# Patient Record
Sex: Male | Born: 1965 | Race: White | Hispanic: No | Marital: Married | Smoking: Former smoker
Health system: Southern US, Community
[De-identification: ages and names within clinical notes are randomized; demographics above are authoritative.]

## PROBLEM LIST (undated history)

## (undated) DIAGNOSIS — G4730 Sleep apnea, unspecified: Secondary | ICD-10-CM

## (undated) DIAGNOSIS — N400 Enlarged prostate without lower urinary tract symptoms: Secondary | ICD-10-CM

## (undated) DIAGNOSIS — I2510 Atherosclerotic heart disease of native coronary artery without angina pectoris: Secondary | ICD-10-CM

## (undated) DIAGNOSIS — C801 Malignant (primary) neoplasm, unspecified: Secondary | ICD-10-CM

## (undated) DIAGNOSIS — E785 Hyperlipidemia, unspecified: Secondary | ICD-10-CM

## (undated) DIAGNOSIS — R112 Nausea with vomiting, unspecified: Secondary | ICD-10-CM

## (undated) DIAGNOSIS — K649 Unspecified hemorrhoids: Secondary | ICD-10-CM

## (undated) DIAGNOSIS — M51369 Other intervertebral disc degeneration, lumbar region without mention of lumbar back pain or lower extremity pain: Secondary | ICD-10-CM

## (undated) DIAGNOSIS — C439 Malignant melanoma of skin, unspecified: Secondary | ICD-10-CM

## (undated) DIAGNOSIS — M5136 Other intervertebral disc degeneration, lumbar region: Secondary | ICD-10-CM

## (undated) DIAGNOSIS — R7303 Prediabetes: Secondary | ICD-10-CM

## (undated) DIAGNOSIS — E039 Hypothyroidism, unspecified: Secondary | ICD-10-CM

## (undated) DIAGNOSIS — K219 Gastro-esophageal reflux disease without esophagitis: Secondary | ICD-10-CM

## (undated) HISTORY — DX: Gastro-esophageal reflux disease without esophagitis: K21.9

## (undated) HISTORY — DX: Malignant (primary) neoplasm, unspecified: C80.1

## (undated) HISTORY — DX: Unspecified hemorrhoids: K64.9

## (undated) HISTORY — DX: Benign prostatic hyperplasia without lower urinary tract symptoms: N40.0

## (undated) HISTORY — DX: Other intervertebral disc degeneration, lumbar region: M51.36

## (undated) HISTORY — DX: Hyperlipidemia, unspecified: E78.5

## (undated) HISTORY — DX: Other intervertebral disc degeneration, lumbar region without mention of lumbar back pain or lower extremity pain: M51.369

## (undated) HISTORY — PX: VASECTOMY: SHX75

## (undated) HISTORY — DX: Malignant melanoma of skin, unspecified: C43.9

## (undated) HISTORY — DX: Atherosclerotic heart disease of native coronary artery without angina pectoris: I25.10

---

## 2003-03-31 ENCOUNTER — Inpatient Hospital Stay (HOSPITAL_COMMUNITY): Admission: EM | Admit: 2003-03-31 | Discharge: 2003-04-02 | Payer: Self-pay | Admitting: Emergency Medicine

## 2014-03-19 ENCOUNTER — Other Ambulatory Visit: Payer: Self-pay | Admitting: Neurosurgery

## 2014-03-19 DIAGNOSIS — M5126 Other intervertebral disc displacement, lumbar region: Secondary | ICD-10-CM

## 2014-03-20 ENCOUNTER — Other Ambulatory Visit: Payer: Self-pay | Admitting: Neurosurgery

## 2014-03-20 ENCOUNTER — Ambulatory Visit
Admission: RE | Admit: 2014-03-20 | Discharge: 2014-03-20 | Disposition: A | Discharge status: Home / Self Care | Payer: BC Managed Care – PPO | Source: Ambulatory Visit | Attending: Neurosurgery | Admitting: Neurosurgery

## 2014-03-20 DIAGNOSIS — M5126 Other intervertebral disc displacement, lumbar region: Secondary | ICD-10-CM

## 2014-03-20 MED ORDER — METHYLPREDNISOLONE ACETATE 40 MG/ML INJ SUSP (RADIOLOG
120.0000 mg | Freq: Once | INTRAMUSCULAR | Status: AC
  Administered 2014-03-20: 120 mg via EPIDURAL

## 2014-03-20 MED ORDER — IOHEXOL 180 MG/ML  SOLN
1.0000 mL | Freq: Once | INTRAMUSCULAR | Status: AC | PRN
  Administered 2014-03-20: 1 mL via EPIDURAL

## 2014-03-20 NOTE — Discharge Instructions (Signed)

## 2014-04-07 ENCOUNTER — Other Ambulatory Visit: Payer: Self-pay | Admitting: Neurosurgery

## 2014-04-07 DIAGNOSIS — M5126 Other intervertebral disc displacement, lumbar region: Secondary | ICD-10-CM

## 2014-04-16 ENCOUNTER — Ambulatory Visit
Admission: RE | Admit: 2014-04-16 | Discharge: 2014-04-16 | Disposition: A | Discharge status: Home / Self Care | Payer: BC Managed Care – PPO | Source: Ambulatory Visit | Attending: Neurosurgery | Admitting: Neurosurgery

## 2014-04-16 DIAGNOSIS — M5126 Other intervertebral disc displacement, lumbar region: Secondary | ICD-10-CM

## 2014-04-16 IMAGING — RF DG FACET JT INJ L OR S SPINE SINGLE LEVEL *L*
1 series · 1 of 1 positions shown · IV contrast (omnipaque)
Comparison: none

CLINICAL DATA: Lumbosacral spondylosis without myelopathy. Facet
mediated lumbago. Excellent relief following the previous trans
foraminal steroid injection.

EXAM:
LEFT L4-5  FACET INJECTION UNDER FLUOROSCOPY
FLUOROSCOPY TIME:  41 seconds
TECHNIQUE: An appropriate skin entry site was determined fluoroscopically and
marked. Operator donned sterile gloves and mask. Site prepped with
betadine, draped in usual sterile fashion, and infiltrated locally
with 1% lidocaine. A 22 gauge spinal needle was advanced to the
posterior margin of the LEFT L4-5 facet under fluoroscopy.
Diagnostic injection of 0.5 ml Omnipaque 180 confirmed
intra-articular spread without any intrathecal or intravascular
component. 120mg Depo-Medrol in 1 ml lidocaine 1% was administered.

[Series 1: dg facet jt inj l /s single level*l*w/fl · 1 of 1 slices shown]
[im 1/1]
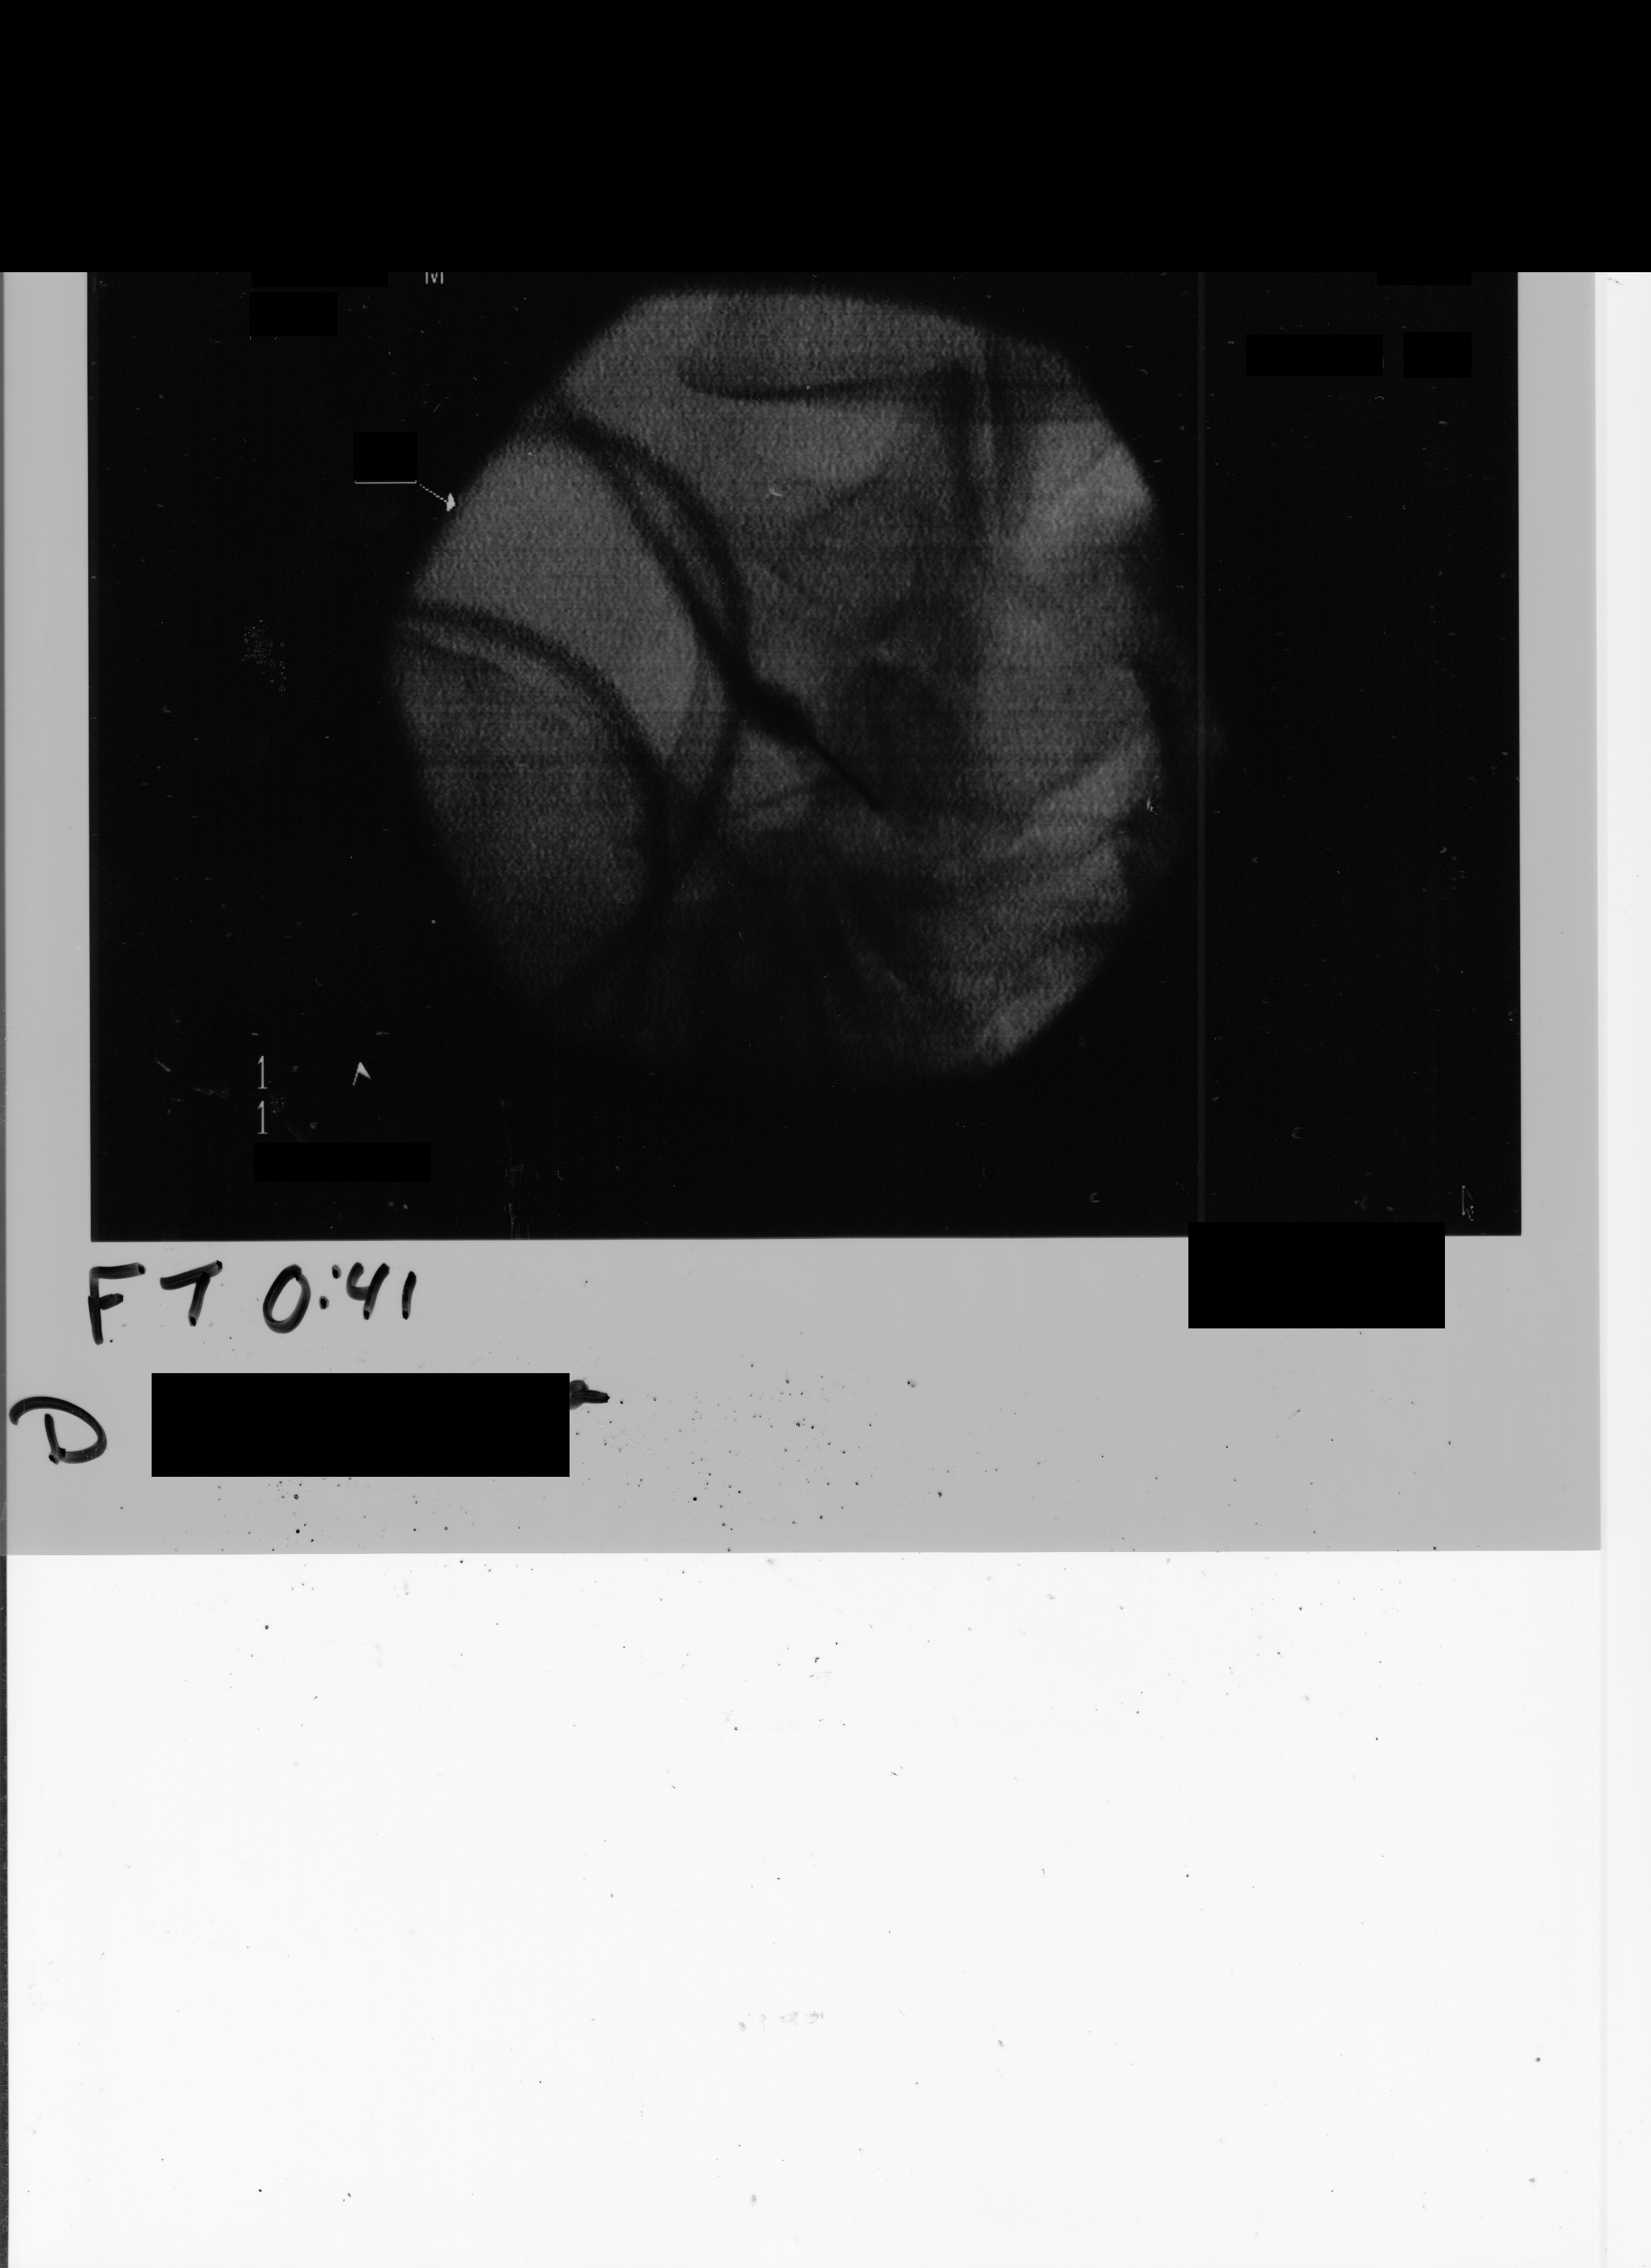

[1 of 1 positions shown; findings below may reference images not displayed]

IMPRESSION: Technically successful LEFT L4-5 facet injection.

## 2014-04-16 MED ORDER — IOHEXOL 180 MG/ML  SOLN
1.0000 mL | Freq: Once | INTRAMUSCULAR | Status: AC | PRN
  Administered 2014-04-16: 1 mL via INTRA_ARTICULAR

## 2014-04-16 MED ORDER — METHYLPREDNISOLONE ACETATE 40 MG/ML INJ SUSP (RADIOLOG
120.0000 mg | Freq: Once | INTRAMUSCULAR | Status: AC
  Administered 2014-04-16: 120 mg via INTRA_ARTICULAR

## 2014-04-16 NOTE — Discharge Instructions (Signed)

## 2015-06-10 ENCOUNTER — Other Ambulatory Visit: Payer: Self-pay | Admitting: Neurosurgery

## 2015-06-10 DIAGNOSIS — M5137 Other intervertebral disc degeneration, lumbosacral region: Secondary | ICD-10-CM

## 2015-06-15 ENCOUNTER — Ambulatory Visit
Admission: RE | Admit: 2015-06-15 | Discharge: 2015-06-15 | Disposition: A | Discharge status: Home / Self Care | Payer: BLUE CROSS/BLUE SHIELD | Source: Ambulatory Visit | Attending: Neurosurgery | Admitting: Neurosurgery

## 2015-06-15 DIAGNOSIS — M5137 Other intervertebral disc degeneration, lumbosacral region: Secondary | ICD-10-CM

## 2015-06-15 MED ORDER — IOHEXOL 180 MG/ML  SOLN
1.0000 mL | Freq: Once | INTRAMUSCULAR | Status: AC | PRN
  Administered 2015-06-15: 1 mL via INTRA_ARTICULAR

## 2015-06-15 MED ORDER — METHYLPREDNISOLONE ACETATE 40 MG/ML INJ SUSP (RADIOLOG
120.0000 mg | Freq: Once | INTRAMUSCULAR | Status: AC
  Administered 2015-06-15: 120 mg via INTRA_ARTICULAR

## 2015-06-15 NOTE — Discharge Instructions (Signed)

## 2015-07-29 ENCOUNTER — Other Ambulatory Visit: Payer: Self-pay | Admitting: Neurosurgery

## 2015-07-29 DIAGNOSIS — M5137 Other intervertebral disc degeneration, lumbosacral region: Secondary | ICD-10-CM

## 2015-08-04 ENCOUNTER — Ambulatory Visit
Admission: RE | Admit: 2015-08-04 | Discharge: 2015-08-04 | Disposition: A | Discharge status: Home / Self Care | Payer: BLUE CROSS/BLUE SHIELD | Source: Ambulatory Visit | Attending: Neurosurgery | Admitting: Neurosurgery

## 2015-08-04 DIAGNOSIS — M5137 Other intervertebral disc degeneration, lumbosacral region: Secondary | ICD-10-CM

## 2015-08-04 MED ORDER — IOHEXOL 180 MG/ML  SOLN
1.0000 mL | Freq: Once | INTRAMUSCULAR | Status: AC | PRN
  Administered 2015-08-04: 1 mL via EPIDURAL

## 2015-08-04 MED ORDER — METHYLPREDNISOLONE ACETATE 40 MG/ML INJ SUSP (RADIOLOG
120.0000 mg | Freq: Once | INTRAMUSCULAR | Status: AC
  Administered 2015-08-04: 120 mg via EPIDURAL

## 2015-08-04 NOTE — Discharge Instructions (Signed)

## 2016-03-03 DIAGNOSIS — R946 Abnormal results of thyroid function studies: Secondary | ICD-10-CM | POA: Diagnosis not present

## 2016-03-03 DIAGNOSIS — Z0000 Encounter for general adult medical examination without abnormal findings: Secondary | ICD-10-CM | POA: Diagnosis not present

## 2016-03-03 DIAGNOSIS — Z125 Encounter for screening for malignant neoplasm of prostate: Secondary | ICD-10-CM | POA: Diagnosis not present

## 2016-03-10 DIAGNOSIS — Z8249 Family history of ischemic heart disease and other diseases of the circulatory system: Secondary | ICD-10-CM | POA: Diagnosis not present

## 2016-03-10 DIAGNOSIS — Z1389 Encounter for screening for other disorder: Secondary | ICD-10-CM | POA: Diagnosis not present

## 2016-03-10 DIAGNOSIS — Z23 Encounter for immunization: Secondary | ICD-10-CM | POA: Diagnosis not present

## 2016-03-10 DIAGNOSIS — M545 Low back pain: Secondary | ICD-10-CM | POA: Diagnosis not present

## 2016-03-10 DIAGNOSIS — Z0000 Encounter for general adult medical examination without abnormal findings: Secondary | ICD-10-CM | POA: Diagnosis not present

## 2016-03-10 DIAGNOSIS — M5136 Other intervertebral disc degeneration, lumbar region: Secondary | ICD-10-CM | POA: Diagnosis not present

## 2016-03-10 DIAGNOSIS — Z8042 Family history of malignant neoplasm of prostate: Secondary | ICD-10-CM | POA: Diagnosis not present

## 2016-03-14 DIAGNOSIS — Z23 Encounter for immunization: Secondary | ICD-10-CM | POA: Diagnosis not present

## 2016-03-17 ENCOUNTER — Other Ambulatory Visit: Payer: Self-pay | Admitting: Neurosurgery

## 2016-03-17 DIAGNOSIS — D2262 Melanocytic nevi of left upper limb, including shoulder: Secondary | ICD-10-CM | POA: Diagnosis not present

## 2016-03-17 DIAGNOSIS — D2261 Melanocytic nevi of right upper limb, including shoulder: Secondary | ICD-10-CM | POA: Diagnosis not present

## 2016-03-17 DIAGNOSIS — D225 Melanocytic nevi of trunk: Secondary | ICD-10-CM | POA: Diagnosis not present

## 2016-03-17 DIAGNOSIS — L738 Other specified follicular disorders: Secondary | ICD-10-CM | POA: Diagnosis not present

## 2016-03-20 ENCOUNTER — Other Ambulatory Visit: Payer: Self-pay | Admitting: Neurosurgery

## 2016-03-20 DIAGNOSIS — M5137 Other intervertebral disc degeneration, lumbosacral region: Secondary | ICD-10-CM

## 2016-03-28 ENCOUNTER — Other Ambulatory Visit: Payer: Self-pay | Admitting: Neurosurgery

## 2016-03-28 ENCOUNTER — Ambulatory Visit
Admission: RE | Admit: 2016-03-28 | Discharge: 2016-03-28 | Disposition: A | Discharge status: Home / Self Care | Payer: BLUE CROSS/BLUE SHIELD | Source: Ambulatory Visit | Attending: Neurosurgery | Admitting: Neurosurgery

## 2016-03-28 DIAGNOSIS — M5137 Other intervertebral disc degeneration, lumbosacral region: Secondary | ICD-10-CM

## 2016-03-28 DIAGNOSIS — M47817 Spondylosis without myelopathy or radiculopathy, lumbosacral region: Secondary | ICD-10-CM | POA: Diagnosis not present

## 2016-03-28 MED ORDER — METHYLPREDNISOLONE ACETATE 40 MG/ML INJ SUSP (RADIOLOG
120.0000 mg | Freq: Once | INTRAMUSCULAR | Status: AC
  Administered 2016-03-28: 120 mg via EPIDURAL

## 2016-03-28 MED ORDER — IOPAMIDOL (ISOVUE-M 200) INJECTION 41%
1.0000 mL | Freq: Once | INTRAMUSCULAR | Status: AC
  Administered 2016-03-28: 1 mL via EPIDURAL

## 2016-03-28 NOTE — Discharge Instructions (Signed)

## 2016-04-03 DIAGNOSIS — Z1211 Encounter for screening for malignant neoplasm of colon: Secondary | ICD-10-CM | POA: Diagnosis not present

## 2016-04-03 DIAGNOSIS — Z8371 Family history of colonic polyps: Secondary | ICD-10-CM | POA: Diagnosis not present

## 2017-04-27 DIAGNOSIS — Z0000 Encounter for general adult medical examination without abnormal findings: Secondary | ICD-10-CM | POA: Diagnosis not present

## 2017-04-27 DIAGNOSIS — Z125 Encounter for screening for malignant neoplasm of prostate: Secondary | ICD-10-CM | POA: Diagnosis not present

## 2017-04-27 DIAGNOSIS — R946 Abnormal results of thyroid function studies: Secondary | ICD-10-CM | POA: Diagnosis not present

## 2017-05-04 DIAGNOSIS — Z1389 Encounter for screening for other disorder: Secondary | ICD-10-CM | POA: Diagnosis not present

## 2017-05-04 DIAGNOSIS — Z23 Encounter for immunization: Secondary | ICD-10-CM | POA: Diagnosis not present

## 2017-05-04 DIAGNOSIS — N401 Enlarged prostate with lower urinary tract symptoms: Secondary | ICD-10-CM | POA: Diagnosis not present

## 2017-05-04 DIAGNOSIS — M5136 Other intervertebral disc degeneration, lumbar region: Secondary | ICD-10-CM | POA: Diagnosis not present

## 2017-05-04 DIAGNOSIS — Z0000 Encounter for general adult medical examination without abnormal findings: Secondary | ICD-10-CM | POA: Diagnosis not present

## 2017-05-04 DIAGNOSIS — E7849 Other hyperlipidemia: Secondary | ICD-10-CM | POA: Diagnosis not present

## 2017-05-04 DIAGNOSIS — R946 Abnormal results of thyroid function studies: Secondary | ICD-10-CM | POA: Diagnosis not present

## 2017-05-25 ENCOUNTER — Ambulatory Visit: Payer: Self-pay | Admitting: Surgery

## 2017-05-25 DIAGNOSIS — K4090 Unilateral inguinal hernia, without obstruction or gangrene, not specified as recurrent: Secondary | ICD-10-CM | POA: Diagnosis not present

## 2017-05-25 NOTE — H&P (Signed)
History of Present Illness Robert Nichols. Robert Colee MD; 05/25/2017 5:03 PM) The patient is a 51 year old male who presents with an inguinal hernia. Referred by Dr. Joylene Draft for left inguinal hernia  This is a 51 year old male who was recently undergoing a routine physical examination by Dr. Joylene Draft when a left inguinal hernia was noted. In retrospect, the patient states that he may have noticed some mild left groin pain intermittently over the last 9 months. The patient is fairly active and works out regularly. Sometimes when he is straining and he will feel some discomfort in his left groin. He denies any swelling or bulging in his groin. He denies any obstructive symptoms. No testicular swelling on either side. He has not had any imaging of this area. He has had a previous vasectomy. He presents now to discuss left inguinal hernia repair. He has no symptoms on the right side.   Past Surgical History (Janette Ranson, CMA; 05/25/2017 3:39 PM) No pertinent past surgical history  Diagnostic Studies History (Janette Ranson, CMA; 05/25/2017 3:39 PM) Colonoscopy 1-5 years ago  Allergies (Janette Ranson, CMA; 05/25/2017 3:42 PM) PenicillAMINE *MISCELLANEOUS THERAPEUTIC CLASSES* Allergies Reconciled  Medication History (Janette Ranson, CMA; 05/25/2017 3:42 PM) Rosuvastatin Calcium (40MG  Tablet, Oral) Active. Medications Reconciled  Social History (Janette Ranson, CMA; 05/25/2017 3:39 PM) Alcohol use Remotely quit alcohol use. Caffeine use Coffee. Illicit drug use Remotely quit drug use. Tobacco use Former smoker.  Family History (Janette Ranson, CMA; 05/25/2017 3:39 PM) Alcohol Abuse Brother, Sister. Arthritis Brother, Mother. Breast Cancer Mother. Colon Polyps Mother. Depression Mother. Heart Disease Father. Heart disease in male family member before age 27 Hypertension Brother, Father, Sister. Prostate Cancer Father. Seizure disorder Mother. Thyroid problems  Mother.  Other Problems (Janette Ranson, CMA; 05/25/2017 3:39 PM) Back Pain Hypercholesterolemia Ventral Hernia Repair     Review of Systems (Janette Ranson CMA; 05/25/2017 3:39 PM) General Not Present- Appetite Loss, Chills, Fatigue, Fever, Night Sweats, Weight Gain and Weight Loss. Skin Not Present- Change in Wart/Mole, Dryness, Hives, Jaundice, New Lesions, Non-Healing Wounds, Rash and Ulcer. HEENT Present- Wears glasses/contact lenses. Not Present- Earache, Hearing Loss, Hoarseness, Nose Bleed, Oral Ulcers, Ringing in the Ears, Seasonal Allergies, Sinus Pain, Sore Throat, Visual Disturbances and Yellow Eyes. Respiratory Not Present- Bloody sputum, Chronic Cough, Difficulty Breathing, Snoring and Wheezing. Breast Not Present- Breast Mass, Breast Pain, Nipple Discharge and Skin Changes. Cardiovascular Not Present- Chest Pain, Difficulty Breathing Lying Down, Leg Cramps, Palpitations, Rapid Heart Rate, Shortness of Breath and Swelling of Extremities. Gastrointestinal Not Present- Abdominal Pain, Bloating, Bloody Stool, Change in Bowel Habits, Chronic diarrhea, Constipation, Difficulty Swallowing, Excessive gas, Gets full quickly at meals, Hemorrhoids, Indigestion, Nausea, Rectal Pain and Vomiting. Male Genitourinary Present- Change in Urinary Stream and Frequency. Not Present- Blood in Urine, Impotence, Nocturia, Painful Urination, Urgency and Urine Leakage.  Vitals (Janette Ranson CMA; 05/25/2017 3:43 PM) 05/25/2017 3:42 PM Weight: 228 lb Height: 72in Body Surface Area: 2.25 m Body Mass Index: 30.92 kg/m  Pulse: 79 (Regular)  BP: 110/80 (Sitting, Left Arm, Standard)      Physical Exam Rodman Key K. Jermond Burkemper MD; 05/25/2017 5:04 PM)  The physical exam findings are as follows: Note:WDWN in NAD Eyes: Pupils equal, round; sclera anicteric HENT: Oral mucosa moist; good dentition Neck: No masses palpated, no thyromegaly Lungs: CTA bilaterally; normal respiratory effort CV:  Regular rate and rhythm; no murmurs; extremities well-perfused with no edema Abd: +bowel sounds, soft, non-tender, no palpable organomegaly; no palpable hernias GU; bilateral descended testes; no testicular masses; small  spontaneously reducible left inguinal hernia; no right inguinal hernia Skin: Warm, dry; no sign of jaundice Psychiatric - alert and oriented x 4; calm mood and affect    Assessment & Plan Rodman Key K. Ernst Cumpston MD; 05/25/2017 4:11 PM)  INGUINAL HERNIA OF LEFT SIDE WITHOUT OBSTRUCTION OR GANGRENE (K40.90)  Current Plans Schedule for Surgery - Left inguinal hernia repair with mesh. The surgical procedure has been discussed with the patient. Potential risks, benefits, alternative treatments, and expected outcomes have been explained. All of the patient's questions at this time have been answered. The likelihood of reaching the patient's treatment goal is good. The patient understand the proposed surgical procedure and wishes to proceed.  Robert Nichols. Georgette Dover, MD, [facility]  General/ Trauma Surgery  05/25/2017 5:04 PM

## 2017-05-29 HISTORY — PX: HERNIA REPAIR: SHX51

## 2017-06-15 DIAGNOSIS — R972 Elevated prostate specific antigen [PSA]: Secondary | ICD-10-CM | POA: Diagnosis not present

## 2017-07-13 DIAGNOSIS — R972 Elevated prostate specific antigen [PSA]: Secondary | ICD-10-CM | POA: Diagnosis not present

## 2017-07-13 DIAGNOSIS — D075 Carcinoma in situ of prostate: Secondary | ICD-10-CM | POA: Diagnosis not present

## 2017-08-03 DIAGNOSIS — I788 Other diseases of capillaries: Secondary | ICD-10-CM | POA: Diagnosis not present

## 2017-08-03 DIAGNOSIS — D2271 Melanocytic nevi of right lower limb, including hip: Secondary | ICD-10-CM | POA: Diagnosis not present

## 2017-08-03 DIAGNOSIS — D2272 Melanocytic nevi of left lower limb, including hip: Secondary | ICD-10-CM | POA: Diagnosis not present

## 2017-08-03 DIAGNOSIS — D2262 Melanocytic nevi of left upper limb, including shoulder: Secondary | ICD-10-CM | POA: Diagnosis not present

## 2017-08-28 DIAGNOSIS — K4090 Unilateral inguinal hernia, without obstruction or gangrene, not specified as recurrent: Secondary | ICD-10-CM | POA: Diagnosis not present

## 2018-06-20 DIAGNOSIS — Z125 Encounter for screening for malignant neoplasm of prostate: Secondary | ICD-10-CM | POA: Diagnosis not present

## 2018-06-20 DIAGNOSIS — R82998 Other abnormal findings in urine: Secondary | ICD-10-CM | POA: Diagnosis not present

## 2018-06-20 DIAGNOSIS — R946 Abnormal results of thyroid function studies: Secondary | ICD-10-CM | POA: Diagnosis not present

## 2018-06-20 DIAGNOSIS — Z0000 Encounter for general adult medical examination without abnormal findings: Secondary | ICD-10-CM | POA: Diagnosis not present

## 2018-06-26 DIAGNOSIS — Z23 Encounter for immunization: Secondary | ICD-10-CM | POA: Diagnosis not present

## 2018-06-26 DIAGNOSIS — N401 Enlarged prostate with lower urinary tract symptoms: Secondary | ICD-10-CM | POA: Diagnosis not present

## 2018-06-26 DIAGNOSIS — Z1331 Encounter for screening for depression: Secondary | ICD-10-CM | POA: Diagnosis not present

## 2018-06-26 DIAGNOSIS — K4090 Unilateral inguinal hernia, without obstruction or gangrene, not specified as recurrent: Secondary | ICD-10-CM | POA: Diagnosis not present

## 2018-06-26 DIAGNOSIS — Z0000 Encounter for general adult medical examination without abnormal findings: Secondary | ICD-10-CM | POA: Diagnosis not present

## 2018-06-26 DIAGNOSIS — M545 Low back pain: Secondary | ICD-10-CM | POA: Diagnosis not present

## 2018-06-26 DIAGNOSIS — R972 Elevated prostate specific antigen [PSA]: Secondary | ICD-10-CM | POA: Diagnosis not present

## 2018-06-26 HISTORY — PX: MICRODISCECTOMY LUMBAR: SUR864

## 2019-01-07 DIAGNOSIS — M5126 Other intervertebral disc displacement, lumbar region: Secondary | ICD-10-CM | POA: Diagnosis not present

## 2019-01-31 DIAGNOSIS — R972 Elevated prostate specific antigen [PSA]: Secondary | ICD-10-CM | POA: Diagnosis not present

## 2019-01-31 DIAGNOSIS — N401 Enlarged prostate with lower urinary tract symptoms: Secondary | ICD-10-CM | POA: Diagnosis not present

## 2019-01-31 DIAGNOSIS — R3915 Urgency of urination: Secondary | ICD-10-CM | POA: Diagnosis not present

## 2019-02-20 DIAGNOSIS — Z23 Encounter for immunization: Secondary | ICD-10-CM | POA: Diagnosis not present

## 2019-08-04 DIAGNOSIS — Z0000 Encounter for general adult medical examination without abnormal findings: Secondary | ICD-10-CM | POA: Diagnosis not present

## 2019-08-04 DIAGNOSIS — R946 Abnormal results of thyroid function studies: Secondary | ICD-10-CM | POA: Diagnosis not present

## 2019-08-04 DIAGNOSIS — E7849 Other hyperlipidemia: Secondary | ICD-10-CM | POA: Diagnosis not present

## 2019-08-04 DIAGNOSIS — Z125 Encounter for screening for malignant neoplasm of prostate: Secondary | ICD-10-CM | POA: Diagnosis not present

## 2019-08-11 ENCOUNTER — Other Ambulatory Visit: Payer: Self-pay | Admitting: Internal Medicine

## 2019-08-11 DIAGNOSIS — R946 Abnormal results of thyroid function studies: Secondary | ICD-10-CM | POA: Diagnosis not present

## 2019-08-11 DIAGNOSIS — N401 Enlarged prostate with lower urinary tract symptoms: Secondary | ICD-10-CM | POA: Diagnosis not present

## 2019-08-11 DIAGNOSIS — E785 Hyperlipidemia, unspecified: Secondary | ICD-10-CM

## 2019-08-11 DIAGNOSIS — Z0000 Encounter for general adult medical examination without abnormal findings: Secondary | ICD-10-CM | POA: Diagnosis not present

## 2019-08-11 DIAGNOSIS — R82998 Other abnormal findings in urine: Secondary | ICD-10-CM | POA: Diagnosis not present

## 2019-08-11 DIAGNOSIS — Z8249 Family history of ischemic heart disease and other diseases of the circulatory system: Secondary | ICD-10-CM | POA: Diagnosis not present

## 2019-08-11 DIAGNOSIS — R972 Elevated prostate specific antigen [PSA]: Secondary | ICD-10-CM | POA: Diagnosis not present

## 2019-08-11 DIAGNOSIS — Z1331 Encounter for screening for depression: Secondary | ICD-10-CM | POA: Diagnosis not present

## 2019-08-15 ENCOUNTER — Ambulatory Visit: Payer: Self-pay | Attending: Internal Medicine

## 2019-08-15 DIAGNOSIS — Z23 Encounter for immunization: Secondary | ICD-10-CM

## 2019-08-15 NOTE — Progress Notes (Signed)
   Covid-19 Vaccination Clinic  Name:  Robert Nichols    MRN: [id number] DOB: 25-Jul-1965  08/15/2019  Robert Nichols was observed post Covid-19 immunization for 15 minutes without incident. He was provided with Vaccine Information Sheet and instruction to access the V-Safe system.   Robert Nichols was instructed to call 911 with any severe reactions post vaccine: Marland Kitchen Difficulty breathing  . Swelling of face and throat  . A fast heartbeat  . A bad rash all over body  . Dizziness and weakness   Immunizations Administered    Name Date Dose VIS Date Route   Pfizer COVID-19 Vaccine 08/15/2019  2:26 PM 0.3 mL 05/09/2019 Intramuscular   Manufacturer: [place]   Lot: [id number]   [place]: [id number]

## 2019-09-09 ENCOUNTER — Ambulatory Visit: Payer: Self-pay | Attending: Internal Medicine

## 2019-09-09 DIAGNOSIS — Z23 Encounter for immunization: Secondary | ICD-10-CM

## 2019-09-09 NOTE — Progress Notes (Signed)
   Covid-19 Vaccination Clinic  Name:  Robert Nichols    MRN: [id number] DOB: [birth year]  09/09/2019  Mr. Robert Nichols was observed post Covid-19 immunization for 15 minutes without incident. He was provided with Vaccine Information Sheet and instruction to access the V-Safe system.   Mr. Robert Nichols was instructed to call 911 with any severe reactions post vaccine: Marland Kitchen Difficulty breathing  . Swelling of face and throat  . A fast heartbeat  . A bad rash all over body  . Dizziness and weakness   Immunizations Administered    Name Date Dose VIS Date Route   Pfizer COVID-19 Vaccine 09/09/2019  2:55 PM 0.3 mL 05/09/2019 Intramuscular   Manufacturer: [place]   Lot: [id number]   [place]: [id number]

## 2019-12-30 ENCOUNTER — Ambulatory Visit
Admission: RE | Admit: 2019-12-30 | Discharge: 2019-12-30 | Disposition: A | Discharge status: Home / Self Care | Payer: BLUE CROSS/BLUE SHIELD | Source: Ambulatory Visit | Attending: Internal Medicine | Admitting: Internal Medicine

## 2019-12-30 DIAGNOSIS — E785 Hyperlipidemia, unspecified: Secondary | ICD-10-CM

## 2020-03-10 ENCOUNTER — Other Ambulatory Visit: Payer: Self-pay | Admitting: Urology

## 2020-03-10 DIAGNOSIS — R972 Elevated prostate specific antigen [PSA]: Secondary | ICD-10-CM

## 2020-04-01 ENCOUNTER — Other Ambulatory Visit: Payer: 59

## 2020-06-18 ENCOUNTER — Other Ambulatory Visit: Payer: 59

## 2020-07-06 ENCOUNTER — Other Ambulatory Visit: Payer: Self-pay

## 2020-07-06 ENCOUNTER — Ambulatory Visit
Admission: RE | Admit: 2020-07-06 | Discharge: 2020-07-06 | Disposition: A | Discharge status: Home / Self Care | Payer: 59 | Source: Ambulatory Visit | Attending: Urology | Admitting: Urology

## 2020-07-06 DIAGNOSIS — R972 Elevated prostate specific antigen [PSA]: Secondary | ICD-10-CM

## 2020-07-06 MED ORDER — GADOBENATE DIMEGLUMINE 529 MG/ML IV SOLN
20.0000 mL | Freq: Once | INTRAVENOUS | Status: AC | PRN
  Administered 2020-07-06: 20 mL via INTRAVENOUS

## 2020-09-16 ENCOUNTER — Encounter: Payer: Self-pay | Admitting: Neurology

## 2020-09-20 ENCOUNTER — Encounter: Payer: Self-pay | Admitting: Neurology

## 2020-09-20 ENCOUNTER — Ambulatory Visit: Payer: 59 | Admitting: Neurology

## 2020-09-20 VITALS — BP 117/81 | HR 65 | Ht 73.0 in | Wt 228.0 lb

## 2020-09-20 DIAGNOSIS — G478 Other sleep disorders: Secondary | ICD-10-CM | POA: Diagnosis not present

## 2020-09-20 DIAGNOSIS — N401 Enlarged prostate with lower urinary tract symptoms: Secondary | ICD-10-CM | POA: Diagnosis not present

## 2020-09-20 DIAGNOSIS — R635 Abnormal weight gain: Secondary | ICD-10-CM | POA: Diagnosis not present

## 2020-09-20 DIAGNOSIS — R0683 Snoring: Secondary | ICD-10-CM | POA: Diagnosis not present

## 2020-09-20 DIAGNOSIS — R351 Nocturia: Secondary | ICD-10-CM

## 2020-09-20 NOTE — Patient Instructions (Signed)

## 2020-09-20 NOTE — Progress Notes (Signed)
SLEEP MEDICINE CLINIC    Provider:  Larey Seat, MD  Primary Care Physician:  Crist Infante, MD [place] [address]     Referring Provider: Crist Infante, [place] [facility] [place],  [place] [address]          Chief Complaint according to patient   Patient presents with:    . New Patient (Initial Visit)     Spouse reports snoring, patient is a former smoker, has not had apnea witnessed.       HISTORY OF PRESENT ILLNESS:  Robert Nichols is a 56 -year- old Caucasian male patient and seen here as a referral on 09/20/2020 from Dr. Joylene Draft, MD. Chief concern according to patient :    He has a past medical history of BPH (benign prostatic hyperplasia), DDD (degenerative disc disease), lumbar, GERD (gastroesophageal reflux disease), stress induced hypertension, weight gain,  Hypothyroidism, and HLD (hyperlipidemia).   Sleep relevant medical history: Nocturia 1-2 times,  No Tonsillectomy , Thyroid disease.    Family medical /sleep history: No other family member on CPAP with OSA, insomnia, sleep walkers. strong history of alcoholism-  sister had liver transplant.  Parents were drinking socially, brother and MGF were alcoholics. .    Social history:  Patient is working as Dispensing optician, Armed forces operational officer-  and lives in a household with  spouse, 3 children, one still at home .  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  Tobacco use*- quit 2010.  ETOH use; none in 8 years-  Caffeine intake in form of Coffee( 4 cups. Before 2 PM.  ) Soda( /) Tea ( some) or energy drinks. Regular exercise in form of gym, 4 days a week.       Sleep habits are as follows: The patient's dinner time is between 6 PM. He may snack after that. The patient goes to bed at 11-12 PM, and continues to sleep for 2-4 hour intervals, wakes for 2 bathroom breaks.   The preferred sleep position is supine, he reports now  having tingling when sleeping on his  sides, arms fall asleep-  with the support of 1 pillows. Dreams are reportedly frequent.  6 AM is the usual rise time. The patient wakes up spontaneously.  He reports somewhat feeling refreshed or restored in AM, - just for the last month he felt unrestored-  , and residual fatigue.  Naps are not taken.   Review of Systems: Out of a complete 14 system review, the patient complains of only the following symptoms, and all other reviewed systems are negative.:  Fatigue,  snoring, fragmented sleep- limited sleep- 4-6 hours of sleep.    How likely are you to doze in the following situations: 0 = not likely, 1 = slight chance, 2 = moderate chance, 3 = high chance   Sitting and Reading? Watching Television? Sitting inactive in a public place (theater or meeting)? As a passenger in a car for an hour without a break? Lying down in the afternoon when circumstances permit? Sitting and talking to someone? Sitting quietly after lunch without alcohol? In a car, while stopped for a few minutes in traffic?   Total = 0/ 24 points   FSS endorsed at 29/ 63 points.   Just last month he felt fatigued- and limited.   Social History   Socioeconomic History  . Marital status: Married    Spouse name: Learta Codding  . Number of  children: 3  . Years of education: 12+  . Highest education level: Not on file  Occupational History  . Occupation: Editor, commissioning  Tobacco Use  . Smoking status: Former Smoker    Packs/day: 0.20    Years: 20.00    Pack years: 4.00    Types: Cigarettes    Quit date: 04/16/2009    Years since quitting: 11.4  . Smokeless tobacco: Never Used  Substance and Sexual Activity  . Alcohol use: Yes    Alcohol/week: 0.0 standard drinks    Comment: occasional beer  . Drug use: Not Currently  . Sexual activity: Not on file  Other Topics Concern  . Not on file  Social History Narrative   Right handed   Caffeine use: 3-4 cups per day   Social Determinants of Health   Financial  Resource Strain: Not on file  Food Insecurity: Not on file  Transportation Needs: Not on file  Physical Activity: Not on file  Stress: Not on file  Social Connections: Not on file    Family History  Problem Relation Age of Onset  . Breast cancer Mother   . Stroke Mother   . Prostate cancer Father   . Colon cancer Maternal Grandmother   . Heart disease Maternal Grandfather   . Heart disease Paternal Grandfather     Past Medical History:  Diagnosis Date  . BPH (benign prostatic hyperplasia)   . Cancer ([place])    pre-melanoma skin cancer  . DDD (degenerative disc disease), lumbar   . GERD (gastroesophageal reflux disease)   . HLD (hyperlipidemia)     Past Surgical History:  Procedure Laterality Date  . HERNIA REPAIR Left 2019   left inguinal hernia repair  . MICRODISCECTOMY LUMBAR  06/26/2018   L4-L5  . VASECTOMY       Current Outpatient Medications on File Prior to Visit  Medication Sig Dispense Refill  . cholecalciferol (VITAMIN D3) 25 MCG (1000 UNIT) tablet Take 1,000 Units by mouth daily.    Marland Kitchen ibuprofen (ADVIL) 200 MG tablet Take 200-400 mg by mouth every 8 (eight) hours as needed.    Marland Kitchen levothyroxine (SYNTHROID) 25 MCG tablet Take 1 tablet by mouth daily.    . Omega-3 300 MG CAPS Take 1 capsule by mouth daily.    . rosuvastatin (CRESTOR) 40 MG tablet Take 40 mg by mouth daily.    . sildenafil (VIAGRA) 100 MG tablet Take 100 mg by mouth daily as needed for erectile dysfunction.     No current facility-administered medications on file prior to visit.    Allergies  Allergen Reactions  . Penicillins Hives    Physical exam:  Today's Vitals   09/20/20 1500  BP: 117/81  Pulse: 65  Weight: 228 lb (103.4 kg)  Height: 6\' 1"  (1.854 m)   Body mass index is 30.08 kg/m.   Wt Readings from Last 3 Encounters:  09/20/20 228 lb (103.4 kg)     Ht Readings from Last 3 Encounters:  09/20/20 6\' 1"  (1.854 m)      General: The patient is awake, alert and appears not  in acute distress. The patient is well groomed. Head: Normocephalic, atraumatic. Neck is supple. Mallampati 2,  neck circumference 17 inches . Nasal airflow patent.   Retrognathia is not seen. No braces or retainers. Dental status: biological  Cardiovascular:  Regular rate and cardiac rhythm by pulse,  without distended neck veins. Respiratory: Lungs are clear to auscultation.  Skin:  Without evidence of ankle edema,  or rash. Trunk: The patient's posture is erect.   Neurologic exam : The patient is awake and alert, oriented to place and time.   Memory subjective described as intact.  Attention span & concentration ability appears normal.  Speech is fluent,  without  dysarthria, dysphonia or aphasia.  Mood and affect are appropriate.   Cranial nerves: no loss of smell or taste reported  Pupils are equal and briskly reactive to light. Funduscopic exam   Extraocular movements in vertical and horizontal planes were intact and without nystagmus. No Diplopia. Visual fields by finger perimetry are intact. Hearing was intact to soft voice and finger rubbing.    Facial sensation intact to fine touch.  Facial motor strength is symmetric and tongue and uvula move midline.  Neck ROM : rotation, tilt and flexion extension were normal for age and shoulder shrug was symmetrical.    Motor exam:  Symmetric bulk, tone and ROM.   Normal tone without cog-wheeling, symmetric grip strength- a little less strong than expected.   Sensory:  Fine touch, pinprick and vibration were normal.  Proprioception tested in the upper extremities was normal.   Coordination: Rapid alternating movements in the fingers/hands were of normal speed. He feels more incoordinated-  The Finger-to-nose maneuver was intact without evidence of ataxia, dysmetria or tremor.   Gait and station: Patient could rise unassisted from a seated position. Toe and heel walk were deferred.  Deep tendon reflexes: in the  upper and lower  extremities are symmetric and intact.  Babinski response was deferred.      After spending a total time of 45 minutes face to face with time for physical and neurologic examination, review of laboratory studies,  personal review of imaging studies, reports and results of other testing and review of referral information / records as far as provided in visit, I have established the following assessments:  1) snoring, and sleeping supine  2) non-restorative sleep 3) more lethargy. 4) BPH 5) thyroid disease/ hypothyroidism. .    My Plan is to proceed with:  1) screening for sleep apnea. HST .  2) multivitamin and mineral to use  Rv in 2-4 month , he may be interested in a dental device with Dr Toy Cookey.    I would like to thank  Crist Infante, [place],  [place] [address] for allowing me to meet with and to take care of this pleasant patient.     Electronically signed by: Larey Seat, MD 09/20/2020 3:55 PM  [facility] and [facility] Sleep Board certified by The [employer] of Sleep Medicine and Diplomate of the [employer] of Sleep Medicine. Board certified In Neurology through the [place], Fellow of the [employer] of Neurology. Medical Director of [employer].

## 2020-09-23 ENCOUNTER — Telehealth: Payer: Self-pay

## 2020-09-23 NOTE — Telephone Encounter (Signed)
LVM for pt to call me back to schedule sleep study  

## 2020-10-20 ENCOUNTER — Ambulatory Visit (INDEPENDENT_AMBULATORY_CARE_PROVIDER_SITE_OTHER): Payer: 59 | Admitting: Neurology

## 2020-10-20 DIAGNOSIS — R351 Nocturia: Secondary | ICD-10-CM

## 2020-10-20 DIAGNOSIS — G4733 Obstructive sleep apnea (adult) (pediatric): Secondary | ICD-10-CM

## 2020-10-20 DIAGNOSIS — R0683 Snoring: Secondary | ICD-10-CM

## 2020-10-20 DIAGNOSIS — G478 Other sleep disorders: Secondary | ICD-10-CM

## 2020-10-20 DIAGNOSIS — N401 Enlarged prostate with lower urinary tract symptoms: Secondary | ICD-10-CM

## 2020-10-20 DIAGNOSIS — R635 Abnormal weight gain: Secondary | ICD-10-CM

## 2020-10-21 NOTE — Progress Notes (Signed)
[facility] (Watch PAT)  STUDY DATE: 10/20/20  DOB: 1966/04/03  MRN: [id number]  ORDERING CLINICIAN: Larey Seat, MD   REFERRING CLINICIAN: Crist Infante, MD   CLINICAL INFORMATION/HISTORY: He has a past medical history of BPH (benign prostatic hyperplasia), DDD (degenerative disc disease), lumbar, GERD (gastroesophageal reflux disease), stress induced hypertension, weight gain,  Hypothyroidism, and HLD (hyperlipidemia).  Reports no sleepiness but nocturia, snoring and is a former smoker.   Epworth sleepiness score: 0/24.  BMI: 30.4 kg/m  Neck Circumference: 17"  FINDINGS:   Total Record Time (hours, min): 8 h 21 min  Total Sleep Time (hours, min):  7 h 31 min   Percent REM (%):    30.80%   Calculated pAHI (per hour): 50.2       REM pAHI: 65.5  NREM pAHI: 44.3 Supine AHI: 62.4   Oxygen Saturation (%) Mean: 94  Minimum oxygen saturation (%):        81   O2 Saturation Range (%): 81-99  O2Saturation (minutes) <=88%: 2 min  Pulse Mean (bpm):    57  Pulse Range (38-104)   IMPRESSION: This HST documented a surprisingly severe level of OSA (obstructive sleep apnea) , with 27% central events, snoring and no REM sleep dependency. No clinically significant hypoxemia was noted.   RECOMMENDATION:  This degree of apnea is best treated with CPAP, auto CPAP 6-16 cm water, 2 cm EPR and mask of patient's comfort and choice. Heated humidification is provided.    INTERPRETING PHYSICIAN:  Larey Seat, MD    [facility] and [facility] Sleep Board certified by The [employer] of Sleep Medicine and Diplomate of the [employer] of Sleep Medicine. Board certified In Neurology through the [place], Fellow of the [employer] of Neurology. Medical Director of [employer].  Sleep Summary  Oxygen Saturation Statistics   Start Study Time: End Study Time: Total Recording Time:      10:11:08 PM 6:32:43 AM 8 h, 21 min  Total  Sleep Time % REM of Sleep Time:  7 h, 31 min  30.8    Mean: 94 Minimum: 81 Maximum: 99  Mean of Desaturations Nadirs (%):   91  Oxygen Desatur. %:  4-9 10-20 >20 Total  Events Number Total   212  7 96.8 3.2  0 0.0  219 100.0  Oxygen Saturation: <90 <=88 <85 <80 <70  Duration (minutes): Sleep % 5.2 1.1 2.0 0.2 0.5 0.0 0.0 0.0 0.0 0.0     Respiratory Indices      Total Events REM NREM All Night  pRDI: pAHI 3%: ODI 4%: pAHIc 3%: %  CSR: pAHI 4%:  362  353  219  159 27.7 281 66.5 65.5 43.0 31.7 45.7 44.3 26.6 19.1 51.5 50.2 31.1 22.6   39.9       Pulse Rate Statistics during Sleep (BPM)      Mean: 57 Minimum: 38 Maximum: 104        Body Position Statistics  Position Supine Prone Right Left Non-Supine  Sleep (min) 197.1 187.0 0.0 67.5 254.5  Sleep % 43.6 41.4 0.0 14.9 56.4  pRDI 62.7 44.1 N/A 37.9 42.4  pAHI 3% 62.4 42.7 N/A 34.1 40.3  ODI 4% 44.6 20.1 N/A 20.8 20.3     Snoring Statistics Snoring Level (dB) >40 >50 >60 >70 >80 >Threshold (45)  Sleep (min) 189.1 7.2 1.8 0.0 0.0 14.3  Sleep % 41.9 1.6 0.4 0.0 0.0 3.2  Mean: 41 dB

## 2020-11-02 DIAGNOSIS — R351 Nocturia: Secondary | ICD-10-CM | POA: Insufficient documentation

## 2020-11-02 DIAGNOSIS — N401 Enlarged prostate with lower urinary tract symptoms: Secondary | ICD-10-CM | POA: Insufficient documentation

## 2020-11-02 DIAGNOSIS — G478 Other sleep disorders: Secondary | ICD-10-CM | POA: Insufficient documentation

## 2020-11-02 DIAGNOSIS — R635 Abnormal weight gain: Secondary | ICD-10-CM | POA: Insufficient documentation

## 2020-11-02 DIAGNOSIS — R0683 Snoring: Secondary | ICD-10-CM | POA: Insufficient documentation

## 2020-11-02 NOTE — Procedures (Signed)
[facility] (Watch PAT)  STUDY DATE: 10/20/20  DOB: 1965-06-21  MRN: [id number]  ORDERING CLINICIAN: Larey Seat, MD   REFERRING CLINICIAN: Crist Infante, MD   CLINICAL INFORMATION/HISTORY: He has a past medical history of BPH (benign prostatic hyperplasia), DDD (degenerative disc disease), lumbar, GERD (gastroesophageal reflux disease), stress induced hypertension, weight gain,  Hypothyroidism, and HLD (hyperlipidemia).  Reports no sleepiness but nocturia, snoring and is a former smoker.   Epworth sleepiness score: 0/24.  BMI: 30.4 kg/m  Neck Circumference: 17"  FINDINGS:   Total Record Time (hours, min): 8 h 21 min  Total Sleep Time (hours, min):  7 h 31 min   Percent REM (%):    30.80%   Calculated pAHI (per hour): 50.2       REM pAHI: 65.5  NREM pAHI: 44.3 Supine AHI: 62.4   Oxygen Saturation (%) Mean: 94  Minimum oxygen saturation (%):        81   O2 Saturation Range (%): 81-99  O2Saturation (minutes) <=88%: 2 min  Pulse Mean (bpm):    57  Pulse Range (38-104)   IMPRESSION: This HST documented a surprisingly severe level of OSA (obstructive sleep apnea) , with 27% central events, snoring and no REM sleep dependency. No clinically significant hypoxemia was noted.   RECOMMENDATION:  This degree of apnea is best treated with CPAP, auto CPAP 6-16 cm water, 2 cm EPR and mask of patient's comfort and choice. Heated humidification is provided.    INTERPRETING PHYSICIAN:  Larey Seat, MD    [facility] and [facility] Sleep Board certified by The [employer] of Sleep Medicine and Diplomate of the [employer] of Sleep Medicine. Board certified In Neurology through the [place], Fellow of the [employer] of Neurology. Medical Director of [employer].  Sleep Summary  Oxygen Saturation Statistics   Start Study Time: End Study Time: Total Recording Time:      10:11:08 PM 6:32:43 AM 8 h, 21 min  Total  Sleep Time % REM of Sleep Time:  7 h, 31 min  30.8    Mean: 94 Minimum: 81 Maximum: 99  Mean of Desaturations Nadirs (%):   91  Oxygen Desatur. %:  4-9 10-20 >20 Total  Events Number Total   212  7 96.8 3.2  0 0.0  219 100.0  Oxygen Saturation: <90 <=88 <85 <80 <70  Duration (minutes): Sleep % 5.2 1.1 2.0 0.2 0.5 0.0 0.0 0.0 0.0 0.0     Respiratory Indices      Total Events REM NREM All Night  pRDI: pAHI 3%: ODI 4%: pAHIc 3%: %  CSR: pAHI 4%:  362  353  219  159 27.7 281 66.5 65.5 43.0 31.7 45.7 44.3 26.6 19.1 51.5 50.2 31.1 22.6   39.9       Pulse Rate Statistics during Sleep (BPM)      Mean: 57 Minimum: 38 Maximum: 104        Body Position Statistics  Position Supine Prone Right Left Non-Supine  Sleep (min) 197.1 187.0 0.0 67.5 254.5  Sleep % 43.6 41.4 0.0 14.9 56.4  pRDI 62.7 44.1 N/A 37.9 42.4  pAHI 3% 62.4 42.7 N/A 34.1 40.3  ODI 4% 44.6 20.1 N/A 20.8 20.3     Snoring Statistics Snoring Level (dB) >40 >50 >60 >70 >80 >Threshold (45)  Sleep (min) 189.1 7.2 1.8 0.0 0.0 14.3  Sleep % 41.9 1.6 0.4 0.0 0.0 3.2

## 2020-11-02 NOTE — Progress Notes (Signed)
   Calculated pAHI (per hour): 50.2                    REM pAHI: 65.5          NREM pAHI: 44.3       Supine AHI: 62.4 IMPRESSION: This HST documented a surprisingly severe level of OSA (obstructive sleep apnea) , with 27% central events, snoring and no REM sleep dependency. No clinically significant hypoxemia was noted.   RECOMMENDATION:  This degree of apnea is best treated with CPAP, auto CPAP 6-16 cm water, 2 cm EPR and mask of patient's comfort and choice. Heated humidification is provided.

## 2020-11-02 NOTE — Addendum Note (Signed)
Addended by: Larey Seat on: 11/02/2020 01:04 PM   Modules accepted: Orders

## 2020-11-03 ENCOUNTER — Telehealth: Payer: Self-pay | Admitting: *Deleted

## 2020-11-03 NOTE — Telephone Encounter (Signed)
-----   Message from Larey Seat, MD sent at 11/02/2020  1:03 PM EDT -----   Calculated pAHI (per hour): 50.2                    REM pAHI: 65.5          NREM pAHI: 44.3       Supine AHI: 62.4 IMPRESSION: This HST documented a surprisingly severe level of OSA (obstructive sleep apnea) , with 27% central events, snoring and no REM sleep dependency. No clinically significant hypoxemia was noted.   RECOMMENDATION:  This degree of apnea is best treated with CPAP, auto CPAP 6-16 cm water, 2 cm EPR and mask of patient's comfort and choice. Heated humidification is provided.

## 2020-11-03 NOTE — Telephone Encounter (Signed)
I spoke to the patient and reviewed the sleep study results. He is agreeable to move forward with CPAP therapy. He understands that it may take 6-12 weeks to get the machine. He will call our office to schedule a follow up between 31-90 days after the start of treatment (for insurance purposes).  I informed him we would send his orders to [facility]. Provided him with their phone number. He will let us know if he does not get a phone call from them within a couple of weeks.

## 2020-12-09 NOTE — Telephone Encounter (Signed)
Received a notification that the patient has returned machine   "The following patient has returned his equipment on 12/08/2020.  Patient states the machine just does not work for him."

## 2021-04-05 NOTE — Progress Notes (Signed)
Cardiology Office Note:    Date:  04/06/2021   ID:  Robert Nichols, DOB 1966/05/22, MRN [id number]  PCP:  Crist Infante, MD   [facility] Providers Cardiologist:  Werner Lean, MD     Referring MD: Crist Infante, MD   CC: Elevated CAC Consulted for the evaluation of CAD NOS at the behest of Crist Infante, MD  History of Present Illness:    Robert Nichols is a 55 y.o. male with a hx of HLD, CAC, and Aortic Atherosclerosis  04/06/21.  Patient notes that he is feeling fine.  Has had no chest pain, chest pressure, chest tightness, chest stinging .   Patient exertion notable for exercising with skiing and mountain biking and feels no symptoms, thought has decreased energy and fatigue.  No shortness of breath, DOE.  No PND or orthopnea.  No weight gain, leg swelling , or abdominal swelling.  No syncope or near syncope . Notes  that he had funny heart beats last fall: was trying to buy a big company and had palpitations, associated with HTN.  Patient reports prior cardiac testing including   Ambulatory BP range 120/80- SBP 130-140 at max.   Past Medical History:  Diagnosis Date   BPH (benign prostatic hyperplasia)    CAD (coronary artery disease)    Cancer (HCC)    pre-melanoma skin cancer   DDD (degenerative disc disease), lumbar    GERD (gastroesophageal reflux disease)    Hemorrhoids    HLD (hyperlipidemia)    Melanoma ([place])     Past Surgical History:  Procedure Laterality Date   HERNIA REPAIR Left 2019   left inguinal hernia repair   MICRODISCECTOMY LUMBAR  06/26/2018   L4-L5   VASECTOMY      Current Medications: Current Meds  Medication Sig   aspirin EC 81 MG tablet Take 1 tablet (81 mg total) by mouth daily. Swallow whole.   cholecalciferol (VITAMIN D3) 25 MCG (1000 UNIT) tablet Take 1,000 Units by mouth daily.   hydrocortisone (ANUSOL-HC) 25 MG suppository as needed for hemorrhoids.   ibuprofen (ADVIL) 200 MG tablet Take 200-400 mg by mouth every 8 (eight)  hours as needed.   levothyroxine (SYNTHROID) 25 MCG tablet Take 1 tablet by mouth daily.   Omega-3 300 MG CAPS Take 1 capsule by mouth daily.   rosuvastatin (CRESTOR) 40 MG tablet Take 40 mg by mouth daily.   sildenafil (VIAGRA) 100 MG tablet Take 100 mg by mouth daily as needed for erectile dysfunction.     Allergies:   Penicillins   Social History   Socioeconomic History   Marital status: Married    Spouse name: Learta Codding   Number of children: 3   Years of education: 12+   Highest education level: Not on file  Occupational History   Occupation: Editor, commissioning  Tobacco Use   Smoking status: Former    Packs/day: 0.20    Years: 20.00    Pack years: 4.00    Types: Cigarettes    Quit date: 04/16/2009    Years since quitting: 11.9   Smokeless tobacco: Never  Substance and Sexual Activity   Alcohol use: Yes    Alcohol/week: 0.0 standard drinks    Comment: occasional beer   Drug use: Not Currently   Sexual activity: Not on file  Other Topics Concern   Not on file  Social History Narrative   Right handed   Caffeine use: 3-4 cups per day   Social Determinants of Health  Financial Resource Strain: Not on file  Food Insecurity: Not on file  Transportation Needs: Not on file  Physical Activity: Not on file  Stress: Not on file  Social Connections: Not on file    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 History of coronary artery disease notable for father (4V CABG), PGF  & MGF(MI- died).  ROS:   Please see the history of present illness.     All other systems reviewed and are negative.  EKGs/Labs/Other Studies Reviewed:    The following studies were reviewed today:  EKG:  EKG is  ordered today.  The ekg  ordered today demonstrates  04/06/21: SR rate 60  CAC Date: 12/30/19 Results: Elevated CAC score Aortic atherosclerosis AAA ULN  Recent Labs: No results found for requested labs within last 8760 hours.  Recent Lipid Panel No results found for: CHOL, TRIG, HDL, CHOLHDL, VLDL, LDLCALC, LDLDIRECT   Physical Exam:    VS:  BP 120/82   Pulse 60   Ht 6\' 1"  (1.854 m)   Wt 226 lb (102.5 kg)   SpO2 96%   BMI 29.82 kg/m     Wt Readings from Last 3 Encounters:  04/06/21 226 lb (102.5 kg)  09/20/20 228 lb (103.4 kg)    Gen: No distress, well nourished Neck: No JVD, no carotid bruit Ears: No Pilar Plate Sign Cardiac: No Rubs or Gallops, no Murmur, normal, +2 radial pulses Respiratory: Clear to auscultation bilaterally, normal effort, normal  respiratory rate GI: Soft, nontender, non-distended  MS: No  edema;  moves all extremities Integument: Skin feels warm Neuro:  At time of evaluation, alert and oriented to person/place/time/situation  Psych: Normal affect, patient feels Ok but a bit stressed   ASSESSMENT:    1. Aortic atherosclerosis ([place])   2. Coronary artery calcification   3. Mixed hyperlipidemia   4. Elevated BP without diagnosis of hypertension    PLAN:    Aortic Atherosclerosis LAD and RCA CAC HLD (mixed) Newly treated hypothyroidism Elevated BP at home - LDL goal < 70 (could consider < 55) - lipids and ALT today next medication would be Zetia 10 mg, continue rosuvastatin 40 mg PO daily - continue Omega 3 for now and will continue AMB BP  - ASA 81 mg PO daily - Reviewed CT with patient, if having anginal equivalent we could pursue CCTA (should be able to read through the LAD CAC with [employer] [facility] [employer])  Will plan for 6 month follow up unless new symptoms or abnormal test results warranting change in plan  Would be reasonable for  APP Follow up      Medication Adjustments/Labs and Tests Ordered: Current medicines are reviewed at length with the patient  today.  Concerns regarding medicines are outlined above.  Orders Placed This Encounter  Procedures   Lipid panel   ALT   EKG 12-Lead    Meds ordered this encounter  Medications   aspirin EC 81 MG tablet    Sig: Take 1 tablet (81 mg total) by mouth daily. Swallow whole.    Dispense:  90 tablet    Refill:  3     Patient Instructions  Medication Instructions:  Your physician has recommended you make the following change in your medication:  START: Asprin  81 mg by mouth daily  *If you need a refill on your cardiac medications before your next appointment, please call your pharmacy*   Lab Work: TODAY: Lipid panel and ALT If you have labs (blood work) drawn today and your tests are completely normal, you will receive your results only by: [place] (if you have MyChart) OR A paper copy in the mail If you have any lab test that is abnormal or we need to change your treatment, we will call you to review the results.   Testing/Procedures: NONE   Follow-Up: At [facility], you and your health needs are our priority.  As part of our continuing mission to provide you with exceptional heart care, we have created designated Provider Care Teams.  These Care Teams include your primary Cardiologist (physician) and Advanced Practice Providers (APPs -  Physician Assistants and Nurse Practitioners) who all work together to provide you with the care you need, when you need it.  We recommend signing up for the patient portal called "MyChart".  Sign up information is provided on this After Visit Summary.  MyChart is used to connect with patients for Virtual Visits (Telemedicine).  Patients are able to view lab/test results, encounter notes, upcoming appointments, etc.  Non-urgent messages can be sent to your provider as well.   To learn more about what you can do with MyChart, go to [email or web address].    Your next appointment:   6 month(s)  The format for your next  appointment:   In Person  Provider:   Werner Lean, MD  or APP :1}     Signed, Werner Lean, MD  04/06/2021 9:23 AM    [place]

## 2021-04-06 ENCOUNTER — Encounter: Payer: Self-pay | Admitting: Internal Medicine

## 2021-04-06 ENCOUNTER — Ambulatory Visit: Payer: 59 | Admitting: Internal Medicine

## 2021-04-06 ENCOUNTER — Other Ambulatory Visit: Payer: Self-pay

## 2021-04-06 VITALS — BP 120/82 | HR 60 | Ht 73.0 in | Wt 226.0 lb

## 2021-04-06 DIAGNOSIS — I2510 Atherosclerotic heart disease of native coronary artery without angina pectoris: Secondary | ICD-10-CM

## 2021-04-06 DIAGNOSIS — I700 Atherosclerosis of aorta: Secondary | ICD-10-CM

## 2021-04-06 DIAGNOSIS — E782 Mixed hyperlipidemia: Secondary | ICD-10-CM | POA: Diagnosis not present

## 2021-04-06 DIAGNOSIS — I2584 Coronary atherosclerosis due to calcified coronary lesion: Secondary | ICD-10-CM

## 2021-04-06 DIAGNOSIS — R030 Elevated blood-pressure reading, without diagnosis of hypertension: Secondary | ICD-10-CM | POA: Diagnosis not present

## 2021-04-06 LAB — ALT: ALT: 31 IU/L (ref 0–44)

## 2021-04-06 LAB — LIPID PANEL
Chol/HDL Ratio: 3.7 ratio (ref 0.0–5.0)
Cholesterol, Total: 172 mg/dL (ref 100–199)
HDL: 47 mg/dL (ref >39)
LDL Chol Calc (NIH): 99 mg/dL (ref 0–99)
Triglycerides: 151 mg/dL — ABNORMAL HIGH (ref 0–149)
VLDL Cholesterol Cal: 26 mg/dL (ref 5–40)

## 2021-04-06 MED ORDER — ASPIRIN EC 81 MG PO TBEC: 81.0000 mg | DELAYED_RELEASE_TABLET | Freq: Every day | ORAL | 3 refills | Status: DC

## 2021-04-06 NOTE — Patient Instructions (Signed)
Medication Instructions:  Your physician has recommended you make the following change in your medication:   START: Asprin  81 mg by mouth daily  *If you need a refill on your cardiac medications before your next appointment, please call your pharmacy*   Lab Work: TODAY: Lipid panel and ALT If you have labs (blood work) drawn today and your tests are completely normal, you will receive your results only by: [place] (if you have MyChart) OR A paper copy in the mail If you have any lab test that is abnormal or we need to change your treatment, we will call you to review the results.   Testing/Procedures: NONE   Follow-Up: At [facility], you and your health needs are our priority.  As part of our continuing mission to provide you with exceptional heart care, we have created designated Provider Care Teams.  These Care Teams include your primary Cardiologist (physician) and Advanced Practice Providers (APPs -  Physician Assistants and Nurse Practitioners) who all work together to provide you with the care you need, when you need it.  We recommend signing up for the patient portal called "MyChart".  Sign up information is provided on this After Visit Summary.  MyChart is used to connect with patients for Virtual Visits (Telemedicine).  Patients are able to view lab/test results, encounter notes, upcoming appointments, etc.  Non-urgent messages can be sent to your provider as well.   To learn more about what you can do with MyChart, go to [email or web address].    Your next appointment:   6 month(s)  The format for your next appointment:   In Person  Provider:   Werner Lean, MD  or APP :1}

## 2021-04-11 ENCOUNTER — Telehealth: Payer: Self-pay | Admitting: Internal Medicine

## 2021-04-11 DIAGNOSIS — E782 Mixed hyperlipidemia: Secondary | ICD-10-CM

## 2021-04-11 DIAGNOSIS — I700 Atherosclerosis of aorta: Secondary | ICD-10-CM

## 2021-04-11 DIAGNOSIS — I2510 Atherosclerotic heart disease of native coronary artery without angina pectoris: Secondary | ICD-10-CM

## 2021-04-11 DIAGNOSIS — I2584 Coronary atherosclerosis due to calcified coronary lesion: Secondary | ICD-10-CM

## 2021-04-11 MED ORDER — EZETIMIBE 10 MG PO TABS: 10.0000 mg | ORAL_TABLET | Freq: Every day | ORAL | 3 refills | Status: DC

## 2021-04-11 NOTE — Telephone Encounter (Signed)
-----   Message from Werner Lean, MD sent at 04/07/2021  1:44 PM EST ----- Results: LDL above set goal Plan: Will add Zetia 10 mg PO daily and get labs in three months  Werner Lean, MD

## 2021-04-11 NOTE — Telephone Encounter (Signed)
Called pt reviewed results and MD recommendations.  Pt agreeable to plan orders placed and lab appointment scheduled.

## 2021-04-11 NOTE — Telephone Encounter (Signed)
Patient returning call for lab results. 

## 2021-07-18 ENCOUNTER — Other Ambulatory Visit: Payer: 59 | Admitting: *Deleted

## 2021-07-18 ENCOUNTER — Other Ambulatory Visit: Payer: Self-pay

## 2021-07-18 DIAGNOSIS — E782 Mixed hyperlipidemia: Secondary | ICD-10-CM

## 2021-07-18 DIAGNOSIS — I2510 Atherosclerotic heart disease of native coronary artery without angina pectoris: Secondary | ICD-10-CM

## 2021-07-18 DIAGNOSIS — I700 Atherosclerosis of aorta: Secondary | ICD-10-CM

## 2021-07-18 DIAGNOSIS — I2584 Coronary atherosclerosis due to calcified coronary lesion: Secondary | ICD-10-CM

## 2021-07-18 LAB — LIPID PANEL
Chol/HDL Ratio: 3 ratio (ref 0.0–5.0)
Cholesterol, Total: 129 mg/dL (ref 100–199)
HDL: 43 mg/dL (ref >39)
LDL Chol Calc (NIH): 57 mg/dL (ref 0–99)
Triglycerides: 172 mg/dL — ABNORMAL HIGH (ref 0–149)
VLDL Cholesterol Cal: 29 mg/dL (ref 5–40)

## 2021-07-18 LAB — ALT: ALT: 64 IU/L — ABNORMAL HIGH (ref 0–44)

## 2021-09-28 ENCOUNTER — Other Ambulatory Visit: Payer: Self-pay | Admitting: Urology

## 2021-09-28 DIAGNOSIS — C61 Malignant neoplasm of prostate: Secondary | ICD-10-CM

## 2021-10-10 NOTE — Progress Notes (Signed)
Cardiology Office Note:    Date:  10/13/2021   ID:  DILLION STOWERS, DOB 1966/03/20, MRN [id number]  PCP:  Crist Infante, MD   [facility] Providers Cardiologist:  Werner Lean, MD     Referring MD: Crist Infante, MD   CC: Elevated CAC f/u  History of Present Illness:    Robert Nichols is a 56 y.o. male with a hx of HLD, CAC, and Aortic Atherosclerosis  04/06/21.  At last visit we started Zetia 10 mg.  Seen 10/03/21.  Patient notes that he is doing well.   Lipids are much improved < 55, TGS have improved.  There are no interval hospital/ED visit.    No chest pain or pressure .  No SOB/DOE and no PND/Orthopnea.  No weight gain or leg swelling.  No palpitations or syncope.  Ambulatory blood pressure 120/80  mmHg.   Past Medical History:  Diagnosis Date   BPH (benign prostatic hyperplasia)    CAD (coronary artery disease)    Cancer (HCC)    pre-melanoma skin cancer   DDD (degenerative disc disease), lumbar    GERD (gastroesophageal reflux disease)    Hemorrhoids    HLD (hyperlipidemia)    Melanoma ([place])     Past Surgical History:  Procedure Laterality Date   HERNIA REPAIR Left 2019   left inguinal hernia repair   MICRODISCECTOMY LUMBAR  06/26/2018   L4-L5   VASECTOMY      Current Medications: Current Meds  Medication Sig   aspirin EC 81 MG tablet Take 1 tablet (81 mg total) by mouth daily. Swallow whole.   cholecalciferol (VITAMIN D3) 25 MCG (1000 UNIT) tablet Take 1,000 Units by mouth daily.   ezetimibe (ZETIA) 10 MG tablet Take 1 tablet (10 mg total) by mouth daily.   hydrocortisone (ANUSOL-HC) 25 MG suppository as needed for hemorrhoids.   ibuprofen (ADVIL) 200 MG tablet Take 200-400 mg by mouth every 8 (eight) hours as needed.   levothyroxine (SYNTHROID) 50 MCG tablet Take 50 mcg by mouth daily.   Omega-3 300 MG CAPS Take 1 capsule by mouth daily.   rosuvastatin (CRESTOR) 40 MG tablet Take 40 mg by mouth daily.   sildenafil (VIAGRA) 100 MG tablet Take 100  mg by mouth daily as needed for erectile dysfunction.   [DISCONTINUED] levothyroxine (SYNTHROID) 25 MCG tablet Take 1 tablet by mouth daily.     Allergies:   Penicillins   Social History   Socioeconomic History   Marital status: Married    Spouse name: Learta Codding   Number of children: 3   Years of education: 12+   Highest education level: Not on file  Occupational History   Occupation: Editor, commissioning  Tobacco Use   Smoking status: Former    Packs/day: 0.20    Years: 20.00    Pack years: 4.00    Types: Cigarettes    Quit date: 04/16/2009    Years since quitting: 12.5   Smokeless tobacco: Never  Substance and Sexual Activity   Alcohol use: Yes    Alcohol/week: 0.0 standard drinks    Comment: occasional beer   Drug use: Not Currently   Sexual activity: Not on file  Other Topics Concern   Not on file  Social History Narrative   Right handed   Caffeine use: 3-4 cups per day   Social Determinants of Health   Financial Resource Strain: Not on file  Food Insecurity: Not on file  Transportation Needs: Not on file  Physical Activity:  Not on file  Stress: Not on file  Social Connections: Not on file    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 History of coronary artery disease notable for father (4V CABG), PGF  & MGF(MI- died).  ROS:   Please see the history of present illness.     All other systems reviewed and are negative.  EKGs/Labs/Other Studies Reviewed:    The following studies were reviewed today:  EKG:   04/06/21: SR rate 60  CAC Date: 12/30/19 Results: Elevated CAC score Aortic  atherosclerosis AAA ULN  Recent Labs: 07/18/2021: ALT 64  Recent Lipid Panel    Component Value Date/Time   CHOL 129 07/18/2021 0759   TRIG 172 (H) 07/18/2021 0759   HDL 43 07/18/2021 0759   CHOLHDL 3.0 07/18/2021 0759   LDLCALC 57 07/18/2021 0759    Physical Exam:    VS:  BP 100/62   Pulse 62   Ht '6\' 2"'$  (1.88 m)   Wt 230 lb (104.3 kg)   SpO2 97%   BMI 29.53 kg/m     Wt Readings from Last 3 Encounters:  10/13/21 230 lb (104.3 kg)  04/06/21 226 lb (102.5 kg)  09/20/20 228 lb (103.4 kg)    Gen: No distress, well nourished Neck: No JVD, no carotid bruit Cardiac: No Rubs or Gallops, no Murmur, normal, +2 radial pulses Respiratory: Clear to auscultation bilaterally, normal effort, normal  respiratory rate GI: Soft, nontender, non-distended  MS: No  edema;  moves all extremities Integument: Skin feels warm Neuro:  At time of evaluation, alert and oriented to person/place/time/situation  Psych: Normal affect, normal affect   ASSESSMENT:    1. Aortic atherosclerosis ([place])   2. Coronary artery calcification   3. Mixed hyperlipidemia     PLAN:    LAD and RCA CAC HLD (mixed) Aortic Atherosclerosis - LDL goal < 55 - continue Omega 3 for now will continue AMB BP   - ASA 81 mg PO daily - reviewed scan with patient and the limited data on benefits for interval CAC - patient to increase physical activity this summer, if needed for anginal equivalent we will get [facility] CCTA; if not PRN f/u       Medication Adjustments/Labs and Tests Ordered: Current medicines are reviewed at length with the patient today.  Concerns regarding medicines are outlined above.  No orders of the defined types were placed in this encounter.   No orders of the defined types were placed in this encounter.    Patient Instructions  Medication Instructions:  Your physician recommends that you continue on your current medications as directed. Please refer to the Current Medication list  given to you today.  *If you need a refill on your cardiac medications before your next appointment, please call your pharmacy*   Lab Work: NONE If you have labs (blood work) drawn today and your tests are completely normal, you will receive your results only by: [place] (if you have MyChart) OR A paper copy in the mail If you have any lab test that is abnormal or we need to change your treatment, we  will call you to review the results.   Testing/Procedures: NONE   Follow-Up: As needed At [facility], you and your health needs are our priority.  As part of our continuing mission to provide you with exceptional heart care, we have created designated Provider Care Teams.  These Care Teams include your primary Cardiologist (physician) and Advanced Practice Providers (APPs -  Physician Assistants and Nurse Practitioners) who all work together to provide you with the care you need, when you need it.   Provider:   Werner Lean, MD      Important Information About Sugar         Signed, Werner Lean, MD  10/13/2021 9:13 AM    [place]

## 2021-10-13 ENCOUNTER — Encounter: Payer: Self-pay | Admitting: Internal Medicine

## 2021-10-13 ENCOUNTER — Ambulatory Visit: Payer: 59 | Admitting: Internal Medicine

## 2021-10-13 VITALS — BP 100/62 | HR 62 | Ht 74.0 in | Wt 230.0 lb

## 2021-10-13 DIAGNOSIS — I2510 Atherosclerotic heart disease of native coronary artery without angina pectoris: Secondary | ICD-10-CM | POA: Diagnosis not present

## 2021-10-13 DIAGNOSIS — I2584 Coronary atherosclerosis due to calcified coronary lesion: Secondary | ICD-10-CM

## 2021-10-13 DIAGNOSIS — E782 Mixed hyperlipidemia: Secondary | ICD-10-CM

## 2021-10-13 DIAGNOSIS — I700 Atherosclerosis of aorta: Secondary | ICD-10-CM | POA: Diagnosis not present

## 2021-10-13 NOTE — Patient Instructions (Signed)
Medication Instructions:  Your physician recommends that you continue on your current medications as directed. Please refer to the Current Medication list given to you today.  *If you need a refill on your cardiac medications before your next appointment, please call your pharmacy*   Lab Work: NONE If you have labs (blood work) drawn today and your tests are completely normal, you will receive your results only by: MyChart Message (if you have MyChart) OR A paper copy in the mail If you have any lab test that is abnormal or we need to change your treatment, we will call you to review the results.   Testing/Procedures: NONE   Follow-Up:As needed  At [facility], you and your health needs are our priority.  As part of our continuing mission to provide you with exceptional heart care, we have created designated Provider Care Teams.  These Care Teams include your primary Cardiologist (physician) and Advanced Practice Providers (APPs -  Physician Assistants and Nurse Practitioners) who all work together to provide you with the care you need, when you need it.  Provider:   Mahesh A Chandrasekhar, MD    Important Information About Sugar       

## 2021-10-18 ENCOUNTER — Other Ambulatory Visit: Payer: 59

## 2021-11-21 ENCOUNTER — Ambulatory Visit
Admission: RE | Admit: 2021-11-21 | Discharge: 2021-11-21 | Disposition: A | Discharge status: Home / Self Care | Payer: 59 | Source: Ambulatory Visit | Attending: Urology | Admitting: Urology

## 2021-11-21 DIAGNOSIS — C61 Malignant neoplasm of prostate: Secondary | ICD-10-CM

## 2021-11-21 MED ORDER — GADOBENATE DIMEGLUMINE 529 MG/ML IV SOLN
20.0000 mL | Freq: Once | INTRAVENOUS | Status: AC | PRN
  Administered 2021-11-21: 20 mL via INTRAVENOUS

## 2022-03-30 ENCOUNTER — Other Ambulatory Visit: Payer: Self-pay | Admitting: Internal Medicine

## 2022-08-30 ENCOUNTER — Other Ambulatory Visit: Payer: Self-pay | Admitting: Internal Medicine

## 2022-12-30 ENCOUNTER — Other Ambulatory Visit: Payer: Self-pay | Admitting: Internal Medicine

## 2023-01-10 ENCOUNTER — Other Ambulatory Visit: Payer: Self-pay | Admitting: Internal Medicine

## 2023-01-25 ENCOUNTER — Other Ambulatory Visit (HOSPITAL_BASED_OUTPATIENT_CLINIC_OR_DEPARTMENT_OTHER): Payer: Self-pay

## 2023-01-25 MED ORDER — ZEPBOUND 10 MG/0.5ML ~~LOC~~ SOAJ
10.0000 mg | SUBCUTANEOUS | 5 refills | Status: DC
  Filled 2023-01-25: qty 2, 28d supply, fill #0
  Filled 2023-03-06 – 2023-03-07 (×2): qty 2, 28d supply, fill #1
  Filled 2023-03-29: qty 2, 28d supply, fill #2
  Filled 2023-04-27: qty 2, 28d supply, fill #3
  Filled 2023-05-25: qty 2, 28d supply, fill #4
  Filled 2023-06-28: qty 2, 28d supply, fill #5

## 2023-01-30 ENCOUNTER — Other Ambulatory Visit: Payer: Self-pay | Admitting: Internal Medicine

## 2023-01-31 ENCOUNTER — Other Ambulatory Visit (HOSPITAL_BASED_OUTPATIENT_CLINIC_OR_DEPARTMENT_OTHER): Payer: Self-pay

## 2023-02-15 ENCOUNTER — Other Ambulatory Visit: Payer: Self-pay | Admitting: Internal Medicine

## 2023-02-22 ENCOUNTER — Other Ambulatory Visit: Payer: Self-pay | Admitting: Internal Medicine

## 2023-02-23 ENCOUNTER — Other Ambulatory Visit: Payer: Self-pay | Admitting: Internal Medicine

## 2023-03-04 ENCOUNTER — Other Ambulatory Visit: Payer: Self-pay | Admitting: Internal Medicine

## 2023-03-07 ENCOUNTER — Other Ambulatory Visit (HOSPITAL_COMMUNITY): Payer: Self-pay

## 2023-03-07 ENCOUNTER — Encounter (HOSPITAL_COMMUNITY): Payer: Self-pay

## 2023-03-21 ENCOUNTER — Other Ambulatory Visit: Payer: Self-pay | Admitting: Internal Medicine

## 2023-03-22 ENCOUNTER — Encounter (HOSPITAL_COMMUNITY): Payer: Self-pay

## 2023-03-22 ENCOUNTER — Other Ambulatory Visit (HOSPITAL_COMMUNITY): Payer: Self-pay

## 2023-03-23 ENCOUNTER — Other Ambulatory Visit (HOSPITAL_COMMUNITY): Payer: Self-pay

## 2023-03-29 ENCOUNTER — Other Ambulatory Visit (HOSPITAL_COMMUNITY): Payer: Self-pay

## 2023-04-05 ENCOUNTER — Other Ambulatory Visit (HOSPITAL_COMMUNITY): Payer: Self-pay

## 2023-04-05 MED ORDER — SILDENAFIL CITRATE 100 MG PO TABS
100.0000 mg | ORAL_TABLET | Freq: Every day | ORAL | 1 refills | Status: DC | PRN
  Filled 2023-04-05: qty 90, 90d supply, fill #0
  Filled 2023-04-27 – 2023-06-28 (×3): qty 90, 90d supply, fill #1

## 2023-04-06 ENCOUNTER — Other Ambulatory Visit (HOSPITAL_COMMUNITY): Payer: Self-pay

## 2023-04-10 ENCOUNTER — Other Ambulatory Visit: Payer: Self-pay | Admitting: Urology

## 2023-04-10 DIAGNOSIS — C61 Malignant neoplasm of prostate: Secondary | ICD-10-CM

## 2023-04-27 ENCOUNTER — Other Ambulatory Visit: Payer: Self-pay | Admitting: Internal Medicine

## 2023-04-27 ENCOUNTER — Other Ambulatory Visit (HOSPITAL_COMMUNITY): Payer: Self-pay

## 2023-04-27 ENCOUNTER — Other Ambulatory Visit: Payer: Self-pay

## 2023-04-30 ENCOUNTER — Other Ambulatory Visit (HOSPITAL_COMMUNITY): Payer: Self-pay

## 2023-05-03 ENCOUNTER — Other Ambulatory Visit: Payer: Self-pay

## 2023-05-03 ENCOUNTER — Telehealth: Payer: Self-pay | Admitting: Internal Medicine

## 2023-05-03 MED ORDER — ASPIRIN 81 MG PO TBEC: 81.0000 mg | DELAYED_RELEASE_TABLET | Freq: Every day | ORAL | 0 refills | Status: AC

## 2023-05-03 NOTE — Telephone Encounter (Signed)
*  STAT* If patient is at the pharmacy, call can be transferred to refill team.   1. Which medications need to be refilled? (please list name of each medication and dose if known)   ezetimibe (ZETIA) 10 MG tablet    2. Which pharmacy/location (including street and city if local pharmacy) is medication to be sent to? [id number] - [place], [place] - [address]   3. Do they need a 30 day or 90 day supply? 90 .  Patient is out of medication Appt is schd 2/6

## 2023-05-03 NOTE — Telephone Encounter (Signed)
Add on    *STAT* If patient is at the pharmacy, call can be transferred to refill team.   1. Which medications need to be refilled? (please list name of each medication and dose if known)   aspirin EC 81 MG tablet    2. Which pharmacy/location (including street and city if local pharmacy) is medication to be sent to? [id number] - [place], [place] - [address]   3. Do they need a 30 day or 90 day supply? 90

## 2023-05-08 ENCOUNTER — Encounter: Payer: Self-pay | Admitting: Urology

## 2023-05-10 ENCOUNTER — Ambulatory Visit
Admission: RE | Admit: 2023-05-10 | Discharge: 2023-05-10 | Disposition: A | Discharge status: Home / Self Care | Payer: 59 | Source: Ambulatory Visit | Attending: Urology | Admitting: Urology

## 2023-05-10 DIAGNOSIS — C61 Malignant neoplasm of prostate: Secondary | ICD-10-CM

## 2023-05-10 MED ORDER — GADOPICLENOL 0.5 MMOL/ML IV SOLN
10.0000 mL | Freq: Once | INTRAVENOUS | Status: AC | PRN
  Administered 2023-05-10: 10 mL via INTRAVENOUS

## 2023-05-25 ENCOUNTER — Other Ambulatory Visit (HOSPITAL_COMMUNITY): Payer: Self-pay

## 2023-05-25 ENCOUNTER — Other Ambulatory Visit: Payer: Self-pay

## 2023-05-26 ENCOUNTER — Other Ambulatory Visit: Payer: 59

## 2023-06-06 ENCOUNTER — Ambulatory Visit: Payer: Self-pay | Admitting: Nurse Practitioner

## 2023-06-06 ENCOUNTER — Telehealth: Payer: Self-pay | Admitting: Internal Medicine

## 2023-06-06 ENCOUNTER — Other Ambulatory Visit (HOSPITAL_COMMUNITY): Payer: Self-pay

## 2023-06-06 MED ORDER — EZETIMIBE 10 MG PO TABS
10.0000 mg | ORAL_TABLET | Freq: Every day | ORAL | 0 refills | Status: DC
  Filled 2023-06-06 – 2023-06-29 (×3): qty 30, 30d supply, fill #0

## 2023-06-06 NOTE — Telephone Encounter (Signed)
*  STAT* If patient is at the pharmacy, call can be transferred to refill team.   1. Which medications need to be refilled? (please list name of each medication and dose if known) ezetimibe  (ZETIA ) 10 MG tablet  TAKE 1 TABLET BY MOUTH EVERY DAY   2. Would you like to learn more about the convenience, safety, & potential cost savings by using the [facility]? No   3. Are you open to using the [facility] No   4. Which pharmacy/location (including street and city if local pharmacy) is medication to be sent to? [id number] - [place], [place] - [address]     5. Do they need a 30 day or 90 day supply? 30 Day Supply  Pt is schedule on 07/05/23

## 2023-06-07 ENCOUNTER — Other Ambulatory Visit: Payer: Self-pay | Admitting: Internal Medicine

## 2023-06-18 ENCOUNTER — Other Ambulatory Visit: Payer: Self-pay | Admitting: Internal Medicine

## 2023-06-18 ENCOUNTER — Other Ambulatory Visit (HOSPITAL_COMMUNITY): Payer: Self-pay

## 2023-06-28 ENCOUNTER — Other Ambulatory Visit: Payer: Self-pay

## 2023-06-29 ENCOUNTER — Other Ambulatory Visit (HOSPITAL_COMMUNITY): Payer: Self-pay

## 2023-07-04 NOTE — Progress Notes (Deleted)
   Cardiology Office Note    Date:  07/04/2023  ID:  Robert Nichols, Robert Nichols 25-Dec-1965, MRN [id number] PCP:  Shayne Anes, MD  Cardiologist:  Stanly DELENA Leavens, MD  Electrophysiologist:  None   Chief Complaint: ***  History of Present Illness: .    Robert Nichols is a 58 y.o. male with visit-pertinent history of coronary calcification by calcium  score (239, 88%ile), HLD, aortic atherosclerosis, BPH, GERD seen on prior imaging seen for follow-up.  Labs outdated, no baselines of regular Possible L3  Coronary calcification Aortic atherosclerosis Hyperlipidemia   Labwork independently reviewed: 2023 LDL 57, trig 172, ALT 64 2022 TSH OK, nothing else available  ROS: .    Please see the history of present illness. Otherwise, review of systems is positive for ***.  All other systems are reviewed and otherwise negative.  Studies Reviewed: SABRA    EKG:  EKG is ordered today, personally reviewed, demonstrating ***  CV Studies: Cardiac studies reviewed are outlined and summarized above. Otherwise please see EMR for full report.   Current Reported Medications:.    No outpatient medications have been marked as taking for the 07/05/23 encounter (Appointment) with Pari Lombard N, PA-C.    Physical Exam:    VS:  There were no vitals taken for this visit.   Wt Readings from Last 3 Encounters:  10/13/21 230 lb (104.3 kg)  04/06/21 226 lb (102.5 kg)  09/20/20 228 lb (103.4 kg)    GEN: Well nourished, well developed in no acute distress NECK: No JVD; No carotid bruits CARDIAC: ***RRR, no murmurs, rubs, gallops RESPIRATORY:  Clear to auscultation without rales, wheezing or rhonchi  ABDOMEN: Soft, non-tender, non-distended EXTREMITIES:  No edema; No acute deformity   Asessement and Plan:.     ***     Disposition: F/u with ***  Signed, Zyniah Ferraiolo N Jerremy Maione, PA-C

## 2023-07-05 ENCOUNTER — Ambulatory Visit: Payer: 59 | Admitting: Physician Assistant

## 2023-07-05 ENCOUNTER — Encounter: Payer: Self-pay | Admitting: Internal Medicine

## 2023-07-05 DIAGNOSIS — I2510 Atherosclerotic heart disease of native coronary artery without angina pectoris: Secondary | ICD-10-CM

## 2023-07-05 DIAGNOSIS — I700 Atherosclerosis of aorta: Secondary | ICD-10-CM

## 2023-07-05 DIAGNOSIS — E785 Hyperlipidemia, unspecified: Secondary | ICD-10-CM

## 2023-07-20 ENCOUNTER — Other Ambulatory Visit (HOSPITAL_COMMUNITY): Payer: Self-pay

## 2023-07-20 MED ORDER — ZEPBOUND 10 MG/0.5ML ~~LOC~~ SOAJ
10.0000 mg | SUBCUTANEOUS | 5 refills | Status: AC
  Filled 2023-07-25: qty 2, 28d supply, fill #0
  Filled 2023-08-22: qty 2, 28d supply, fill #1
  Filled 2023-10-02: qty 2, 28d supply, fill #2
  Filled 2023-10-20 – 2023-10-26 (×2): qty 2, 28d supply, fill #3
  Filled 2023-11-26: qty 2, 28d supply, fill #4
  Filled 2024-04-14: qty 2, 28d supply, fill #5
  Filled ????-??-??: fill #0

## 2023-07-22 ENCOUNTER — Other Ambulatory Visit (HOSPITAL_COMMUNITY): Payer: Self-pay

## 2023-07-22 ENCOUNTER — Other Ambulatory Visit: Payer: Self-pay | Admitting: Internal Medicine

## 2023-07-22 NOTE — Progress Notes (Unsigned)
 Cardiology Office Note:  .   Date:  07/23/2023  ID:  [name], DOB 1966/05/12, MRN [id number] PCP: Rodrigo Ran, MD  [facility] Providers Cardiologist:  Christell Constant, MD    Patient Profile: .      PMH Hyperlipidemia Coronary artery calcification CT Calcium score 12/30/2019 CAC score 239 (88th percentile)  LM 0, LAD 210, LCx 22.8, RCA 6.57 Aortic atherosclerosis GERD Family history CAD Hypothyroidism Former tobacco abuse  He was referred to cardiology and seen by Dr. Izora Ribas on 04/06/2021 for coronary calcification.  CT calcium score 12/30/2019 revealed total Agatston score 239 (88th percentile). He has expelled [facility] here reported exercising with skiing and mountain biking and denied any concerning cardiac symptoms.  He noted occasional palpitations associated with elevated BP during a stressful time in the past.  He reported no prior cardiac testing.  Ambulatory BP typically 120/80.  Family history notable for father who underwent four-vessel CABG, paternal grandfather and maternal grandfather who died from an MI.  His mother has history of stroke. LDL was 99 at that time and ezetimibe 10 mg daily was added to rosuvastatin 40 mg daily. LDL-C improved to 57 on 07/18/21. He was advised to start asa 81 mg daily.   Last cardiology clinic visit was 10/13/2021 with Dr. Izora Ribas. He was encouraged to increase physical activity and notify us if he developed anginal symptoms.        History of Present Illness: .   Robert Nichols is a very pleasant 58 y.o. male who is here today for follow-up of CAD. He reports significant weight loss of 35 pounds on Zepbound, which has led to improvements in his overall health and blood pressure. He is feeling well and is active with various forms of exercise including body pump and cycling classes.  He has not had any concerning cardiac symptoms, including no chest pain, shortness of breath, orthopnea, PND, palpitations, edema,  presyncope, syncope. He is considering intermittent fasting and is seeking advice on diet and nutrition. We reviewed prior CT calcium score and discussed timeliness of repeating the test as well as potential other tests that may be indicated should he become symptomatic.   Discussed the use of AI scribe software for clinical note transcription with the patient, who gave verbal consent to proceed.   ROS: See HPI       Studies Reviewed: Marland Kitchen   EKG Interpretation Date/Time:  Monday July 23 2023 13:59:38 EST Ventricular Rate:  60 PR Interval:  150 QRS Duration:  92 QT Interval:  422 QTC Calculation: 422 R Axis:   35  Text Interpretation: Normal sinus rhythm Normal ECG Confirmed by Eligha Bridegroom [phone]) on 07/23/2023 2:08:03 PM    Risk Assessment/Calculations:             Physical Exam:   VS:  BP 100/82   Pulse 60   Ht 6\' 1"  (1.854 m)   Wt 211 lb (95.7 kg)   SpO2 94%   BMI 27.84 kg/m    Wt Readings from Last 3 Encounters:  07/23/23 211 lb (95.7 kg)  10/13/21 230 lb (104.3 kg)  04/06/21 226 lb (102.5 kg)    GEN: Well nourished, well developed in no acute distress NECK: No JVD; No carotid bruits CARDIAC: RRR, no murmurs, rubs, gallops RESPIRATORY:  Clear to auscultation without rales, wheezing or rhonchi  ABDOMEN: Soft, non-tender, non-distended EXTREMITIES:  No edema; No deformity     ASSESSMENT AND PLAN: .    CAD: CT  calcium score 12/30/2019 with total Agatston score 239 (88th percentile) with bulk of calcium in LAD.  He is very active with regular cardiovascular and weight training exercise and denies chest pain, dyspnea, or other symptoms concerning for angina.  No indication for further ischemic evaluation at this time.  EKG today is unremarkable.  We discussed potential testing in the future if he becomes symptomatic. He has achieved ~ 30 lb weight loss on Zepbound. Encouraged him to focus on secondary prevention including heart healthy mostly plant based diet avoiding  saturated fat, processed foods, simple carbohydrates, and sugar along with aiming for at least 150 minutes of moderate intensity exercise each week. He is seeking guidance on nutrition; information provided. No bleeding concerns. Continue aspirin, ezetimibe, rosuvastatin.  Hyperlipidemia LDL goal < 70: Lipid panel 01/25/2023: Total cholesterol 115, HDL 45, LDL 42, and triglycerides 161.  He reports a sooner lipid panel with PCP, however I do not have these results to review.  No changes were recommended by PCP.  He is tolerating medications without concerning side effects.  We will continue ezetimibe 10 mg daily and rosuvastatin 40 mg daily.  Weight management: He has been on Zepbound for several months and has achieved ~ 30 lb weight loss.  Lengthy discussion about healthy diet and potential intermittent fasting. We also discussed measuring BMI versus more in-depth testing like body scanning for visceral fat. He is concerned about the long-term side effects of Zepbound. I encouraged him to consider continuing Zepbound until he has established healthy eating lifestyle and is content with weight loss, as well as following recommendations of PCP. Nutrition information provided.   CV Risk counseling: We discussed the pillars of cardiovascular disease.  He is not diabetic and does not have history of hypertension. He is a former smoker. Encouraged continued lifestyle management of hyperlipidemia and CAD along with prescription medications.         Disposition:1 year with Dr. Izora Ribas  Signed, Eligha Bridegroom, NP-C

## 2023-07-23 ENCOUNTER — Ambulatory Visit: Payer: 59 | Attending: Nurse Practitioner | Admitting: Nurse Practitioner

## 2023-07-23 ENCOUNTER — Other Ambulatory Visit (HOSPITAL_COMMUNITY): Payer: Self-pay

## 2023-07-23 ENCOUNTER — Encounter: Payer: Self-pay | Admitting: Nurse Practitioner

## 2023-07-23 VITALS — BP 100/82 | HR 60 | Ht 73.0 in | Wt 211.0 lb

## 2023-07-23 DIAGNOSIS — Z7189 Other specified counseling: Secondary | ICD-10-CM

## 2023-07-23 DIAGNOSIS — I2510 Atherosclerotic heart disease of native coronary artery without angina pectoris: Secondary | ICD-10-CM

## 2023-07-23 DIAGNOSIS — Z789 Other specified health status: Secondary | ICD-10-CM | POA: Diagnosis not present

## 2023-07-23 DIAGNOSIS — E785 Hyperlipidemia, unspecified: Secondary | ICD-10-CM | POA: Diagnosis not present

## 2023-07-23 DIAGNOSIS — I700 Atherosclerosis of aorta: Secondary | ICD-10-CM

## 2023-07-23 MED ORDER — EZETIMIBE 10 MG PO TABS
10.0000 mg | ORAL_TABLET | Freq: Every day | ORAL | 2 refills | Status: DC
Start: 2023-07-23 — End: 2023-07-23
  Filled ????-??-??: fill #0

## 2023-07-23 MED ORDER — EZETIMIBE 10 MG PO TABS
10.0000 mg | ORAL_TABLET | Freq: Every day | ORAL | 2 refills | Status: AC
  Filled 2023-07-23: qty 90, 90d supply, fill #0
  Filled 2023-08-22 – 2023-10-03 (×3): qty 90, 90d supply, fill #1
  Filled 2023-11-26 – 2024-04-14 (×2): qty 90, 90d supply, fill #2

## 2023-07-23 NOTE — Patient Instructions (Signed)
 Medication Instructions:   Your physician recommends that you continue on your current medications as directed. Please refer to the Current Medication list given to you today.   *If you need a refill on your cardiac medications before your next appointment, please call your pharmacy*   Lab Work:  None ordered.  If you have labs (blood work) drawn today and your tests are completely normal, you will receive your results only by: MyChart Message (if you have MyChart) OR A paper copy in the mail If you have any lab test that is abnormal or we need to change your treatment, we will call you to review the results.   Testing/Procedures:  None ordered.   Follow-Up: At [facility], you and your health needs are our priority.  As part of our continuing mission to provide you with exceptional heart care, we have created designated Provider Care Teams.  These Care Teams include your primary Cardiologist (physician) and Advanced Practice Providers (APPs -  Physician Assistants and Nurse Practitioners) who all work together to provide you with the care you need, when you need it.  We recommend signing up for the patient portal called "MyChart".  Sign up information is provided on this After Visit Summary.  MyChart is used to connect with patients for Virtual Visits (Telemedicine).  Patients are able to view lab/test results, encounter notes, upcoming appointments, etc.  Non-urgent messages can be sent to your provider as well.   To learn more about what you can do with MyChart, go to [email or web address].    Your next appointment:   1 year(s)  Provider:   Christell Constant, MD     Other Instructions  Your physician wants you to follow-up in: 1 year.  You will receive a reminder letter in the mail two months in advance. If you don't receive a letter, please call our office to schedule the follow-up appointment.    1st Floor: - Lobby - Registration  - Pharmacy  -  Lab - Cafe  2nd Floor: - PV Lab - Diagnostic Testing (echo, CT, nuclear med)  3rd Floor: - Vacant  4th Floor: - TCTS (cardiothoracic surgery) - AFib Clinic - Structural Heart Clinic - Vascular Surgery  - Vascular Ultrasound  5th Floor: - HeartCare Cardiology (general and EP) - Clinical Pharmacy for coumadin, hypertension, lipid, weight-loss medications, and med management appointments    Valet parking services will be available as well.    Adopting a Healthy Lifestyle.   Weight: Know what a healthy weight is for you (roughly BMI <25) and aim to maintain this. You can calculate your body mass index on your smart phone. Unfortunately, this is not the most accurate measure of healthy weight, but it is the simplest measurement to use. A more accurate measurement involves body scanning which measures lean muscle, fat tissue and bony density. We do not have this equipment at [facility].    Diet: Aim for 7+ servings of fruits and vegetables daily Limit animal fats in diet for cholesterol and heart health - choose grass fed whenever available Avoid highly processed foods (fast food burgers, tacos, fried chicken, pizza, hot dogs, french fries)  Saturated fat comes in the form of butter, lard, coconut oil, margarine, partially hydrogenated oils, and fat in meat. These increase your risk of cardiovascular disease.  Use healthy plant oils, such as olive, canola, soy, corn, sunflower and peanut.  Whole foods such as fruits, vegetables and whole grains have fiber  Men need > 38 grams  of fiber per day Women need > 25 grams of fiber per day  Load up on vegetables and fruits - one-half of your plate: Aim for color and variety, and remember that potatoes dont count. Go for whole grains - one-quarter of your plate: Whole wheat, barley, wheat berries, quinoa, oats, brown rice, and foods made with them. If you want pasta, go with whole wheat pasta. Protein power - one-quarter of your plate: Fish,  chicken, beans, and nuts are all healthy, versatile protein sources. Limit red meat. You need carbohydrates for energy! The type of carbohydrate is more important than the amount. Choose carbohydrates such as vegetables, fruits, whole grains, beans, and nuts in the place of white rice, white pasta, potatoes (baked or fried), macaroni and cheese, cakes, cookies, and donuts.  If youre thirsty, drink water. Coffee and tea are good in moderation, but skip sugary drinks and limit milk and dairy products to one or two daily servings. Keep sugar intake at 6 teaspoons or 24 grams or LESS       Exercise: Aim for 150 min of moderate intensity exercise weekly for heart health, and weights twice weekly for bone health Stay active - any steps are better than no steps! Aim for 7-9 hours of sleep daily          Mediterranean Diet  Why follow it? Research shows. Those who follow the Mediterranean diet have a reduced risk of heart disease  The diet is associated with a reduced incidence of Parkinson's and Alzheimer's diseases People following the diet may have longer life expectancies and lower rates of chronic diseases  The Dietary Guidelines for Americans recommends the Mediterranean diet as an eating plan to promote health and prevent disease  What Is the Mediterranean Diet?  Healthy eating plan based on typical foods and recipes of Mediterranean-style cooking The diet is primarily a plant based diet; these foods should make up a majority of meals   Starches - Plant based foods should make up a majority of meals - They are an important sources of vitamins, minerals, energy, antioxidants, and fiber - Choose whole grains, foods high in fiber and minimally processed items  - Typical grain sources include wheat, oats, barley, corn, brown rice, bulgar, farro, millet, polenta, couscous  - Various types of beans include chickpeas, lentils, fava beans, black beans, white beans   Fruits  Veggies - Large  quantities of antioxidant rich fruits & veggies; 6 or more servings  - Vegetables can be eaten raw or lightly drizzled with oil and cooked  - Vegetables common to the traditional Mediterranean Diet include: artichokes, arugula, beets, broccoli, brussel sprouts, cabbage, carrots, celery, collard greens, cucumbers, eggplant, kale, leeks, lemons, lettuce, mushrooms, okra, onions, peas, peppers, potatoes, pumpkin, radishes, rutabaga, shallots, spinach, sweet potatoes, turnips, zucchini - Fruits common to the Mediterranean Diet include: apples, apricots, avocados, cherries, clementines, dates, figs, grapefruits, grapes, melons, nectarines, oranges, peaches, pears, pomegranates, strawberries, tangerines  Fats - Replace butter and margarine with healthy oils, such as olive oil, canola oil, and tahini  - Limit nuts to no more than a handful a day  - Nuts include walnuts, almonds, pecans, pistachios, pine nuts  - Limit or avoid candied, honey roasted or heavily salted nuts - Olives are central to the [employer] - can be eaten whole or used in a variety of dishes   Meats Protein - Limiting red meat: no more than a few times a month - When eating red meat: choose lean cuts  and keep the portion to the size of deck of cards - Eggs: approx. 0 to 4 times a week  - Fish and lean poultry: at least 2 a week  - Healthy protein sources include, chicken, [place], lean beef, lamb - Increase intake of seafood such as tuna, salmon, trout, mackerel, shrimp, scallops - Avoid or limit high fat processed meats such as sausage and bacon  Dairy - Include moderate amounts of low fat dairy products  - Focus on healthy dairy such as fat free yogurt, skim milk, low or reduced fat cheese - Limit dairy products higher in fat such as whole or 2% milk, cheese, ice cream  Alcohol - Moderate amounts of red wine is ok  - No more than 5 oz daily for women (all ages) and men older than age 27  - No more than 10 oz of wine daily  for men younger than 36  Other - Limit sweets and other desserts  - Use herbs and spices instead of salt to flavor foods  - Herbs and spices common to the traditional Mediterranean Diet include: basil, bay leaves, chives, cloves, cumin, fennel, garlic, lavender, marjoram, mint, oregano, parsley, pepper, rosemary, sage, savory, sumac, tarragon, thyme   It's not just a diet, it's a lifestyle:  The Mediterranean diet includes lifestyle factors typical of those in the region  Foods, drinks and meals are best eaten with others and savored Daily physical activity is important for overall good health This could be strenuous exercise like running and aerobics This could also be more leisurely activities such as walking, housework, yard-work, or taking the stairs Moderation is the key; a balanced and healthy diet accommodates most foods and drinks Consider portion sizes and frequency of consumption of certain foods   Meal Ideas & Options:  Breakfast:  Whole wheat toast or whole wheat English muffins with peanut butter & hard boiled egg Steel cut oats topped with apples & cinnamon and skim milk  Fresh fruit: banana, strawberries, melon, berries, peaches  Smoothies: strawberries, bananas, greek yogurt, peanut butter Low fat greek yogurt with blueberries and granola  Egg white omelet with spinach and mushrooms Breakfast couscous: whole wheat couscous, apricots, skim milk, cranberries  Sandwiches:  Hummus and grilled vegetables (peppers, zucchini, squash) on whole wheat bread   Grilled chicken on whole wheat pita with lettuce, tomatoes, cucumbers or tzatziki  [place] salad on whole wheat bread: tuna salad made with greek yogurt, olives, red peppers, capers, green onions Garlic rosemary lamb pita: lamb sauted with garlic, rosemary, salt & pepper; add lettuce, cucumber, greek yogurt to pita - flavor with lemon juice and black pepper  Seafood:  Mediterranean grilled salmon, seasoned with garlic, basil,  parsley, lemon juice and black pepper Shrimp, lemon, and spinach whole-grain pasta salad made with low fat greek yogurt  Seared scallops with lemon orzo  Seared tuna steaks seasoned salt, pepper, coriander topped with tomato mixture of olives, tomatoes, olive oil, minced garlic, parsley, green onions and cappers  Meats:  Herbed greek chicken salad with kalamata olives, cucumber, feta  Red bell peppers stuffed with spinach, bulgur, lean ground beef (or lentils) & topped with feta   Kebabs: skewers of chicken, tomatoes, onions, zucchini, squash  [place] burgers: made with red onions, mint, dill, lemon juice, feta cheese topped with roasted red peppers Vegetarian Cucumber salad: cucumbers, artichoke hearts, celery, red onion, feta cheese, tossed in olive oil & lemon juice  Hummus and whole grain pita points with a greek salad (lettuce, tomato,  feta, olives, cucumbers, red onion) Lentil soup with celery, carrots made with vegetable broth, garlic, salt and pepper  Tabouli salad: parsley, bulgur, mint, scallions, cucumbers, tomato, radishes, lemon juice, olive oil, salt and pepper.  Tackling Obesity with Lifestyle Changes  Obesity- What is it? And What can we do about it?  Obesity is a chronic complex disease defined as excessive fat deposits that can have a negative effect on our health. It can lead to many other diseases including type 2 diabetes.  Weight gain occurs when the amount of energy (calories) we consume is greater than the amount we use.  When our energy output is greater than our energy input we lose weight. The basic concept is simple, but in reality, it's much more complicated.  Unfortunately, in some people, our bodies have many ways it can compensate when we try to eat less and move more which can prevent [place] from changing our weight. This can lead to some people having a much more difficult time losing weight even when they put healthy habits into practice. This can be frustrating.  We want to focus on healthy habits, physical activity and how we feel, and less the number on the scale.  Food As Energy  Calories  Calories is just a unit of measurement for energy.  Counting calories is not required to lose weight but counting for a short period of time can:   help you learn good portion sizes   Learn what your true energy needs are.   Help you be more aware of your snacking or grazing habits  To help calculate how many calories you should be eating, the NIH has a great body weight planner calculator at BeverageBuggy.si  Types of Energy Expenditure  Basal Metabolic Rate (BMR) Energy that our bodies use to preform everyday tasks. More muscle mass through resistance training can increase this a small amount  Thermic Effect of Food The amount of energy that it takes to breakdown the food we eat. This will be highest when we eat protein and fiber rich foods  Exercise Energy Expenditure The amount of energy used during formal exercise (walking, biking, weightlifting)  Non-exercise activity thermogenesis (NEAT) The amount of energy spent on activities that are not formal exercise (standing, fidgeting). Therefore, it is not only important to do formal exercise but also move around throughout the day.  Managing The Meal  Macro nutrients (carbohydrates, fats and protein, fiber, water)  Micronutrients (vitamins, minerals)  Dietary Fiber  Benefits Examples Cautions  Soluble fiber  Decreases cholesterol  improve blood sugar control,  Feeds our gut bacteria  Allows [place] to feel fuller for longer so we eat less  fruits  oats  barley  legumes  peas  Beans  vegetables (broccoli) and root vegetables (carrots) Add fiber into your diet slowly and be sure to drink at least 8 cups of water a day. This will help limit gas, bloating, diarrhea, or constipation.  Insoluble Fiber  Improves digestive health by making stool easier to pass  Allows [place] to feel fuller  for longer so we eat less  whole grains  nuts  seeds  skin of fruit  vegetables (green beans, zucchini, cauliflower)  Tricks to add more fiber to your diet   Add beans (pinto, kidney, lima, navy and garbanzo) to salads, ground meat or brown rice   Add nuts or seeds and or fresh/frozen fruit to yogurt, cottage cheese, salads or steel cut oats   Cut up vegetables and eat with  hummus   Look for unsweetened whole grain cereals with at least 5g of fiber per serving   Switch to whole grain bread. Look for bread that has whole grain flour as the first ingredient and has more fiber than carbs if you were to multiple the fiber x 10.   Try bulgar, barely, quinoa, buckwheat, brown rice wild rice instead of white rice   Keep frozen vegetables on hand to add to dishes or soups  Meal Planning:  Meal planning is the key to setting you up for success. Here are some examples of healthy meal options.  Breakfast  Option 1: Omelette with vegetables (1 egg, spinach, mushrooms, or other vegetable of your choice), 2 slices whole-grain toast, tip of thumb size butter or soft margarine,  cup low-fat milk or yogurt  Option 2: steel-cut rolled oats (? cup dry), 1 tbsp peanut butter added to cooked oats,  cup low-fat milk.  Option 3: 2 slices whole-grain or rye toast with avocado spread ( small avocado mased with herbs and pepper to taste), 1 poached egg or [place] up (cooked to your liking)  Option 4:  cup plain 0% [place] yogurt topped with  cup berries and  cup walnuts or almonds, 2 slices whole-grain or rye toast, tip of thumb size soft margarine/butter  Lunch:  Option 1: 2 cups red lentil soup, green salad with 1 tbsp homemade vinaigrette (extra virgin olive oil and vinegar of choice plus spices)  Option 2: 3 oz. roasted chicken, 2 slices whole-grain bread, 2 tsp mayonnaise, mustard, lettuce, tomato if desired, 1 fruit (example: medium-sized apple or small pear)  Option 3: 3 oz. tuna  packed in water, 1 whole-wheat pita (6 inch), 2 tsp mayonnaise, lettuce, tomato, or other non-starchy vegetable of your choice, 1 fruit (example: medium-sized apple or small pear)  Option 4: 1 serving of garden veggie buddha bowl with lentils and tahini sauce and 1 cup berries topped with  cup plain 0% Greek yogurt  Dinner:  Option 1: 1 serving roasted cauliflower salad, 3-4 oz. grilled or baked pork loin chop, 1/2 cup mashed potato, or brown rice or quinoa  Option 2: 1 serving fish (baked, grilled or air fried), green salad, 1 tbsp homemade vinaigrette,  cup cooked couscous  Option 3: 1 cup cooked whole grained pasta (example: spaghetti, spirals, macaroni),  cup favorite pasta sauce (preferably homemade), 3-4 oz. grilled or baked chicken, green salad, 1 tbsp homemade vinaigrette  Option 4: 1 serving oven roasted salmon,  cup mashed sweet potato or couscous or brown rice or quinoa, broccoli (steamed or roasted)  Healthy snacks:   Carrots or celery with 1 tbsp of hummus   1 medium-sized fruit (apple or orange)   1 cup plain 0% [place] yogurt with  cup berries   Half apple, sliced, with 1 tbsp (15 mL) peanut or almond butter  Dining out:  Eating away from home has become a part of many people's lifestyle. Making healthy choices when you are eating out is important too. Portion size is an important part of healthy choices. Most branded fast-food places provide calories, sodium, and fat content for their menu items. www.calorieking.com would be great resource to find nutrition facts for your favorite brands and fast-food restaurants. Company specific website can be Chief Technology Officer for nutrition information for their items. (e.g. www.mcdonalds.com or www.nutritionix.com/[place]/menu/premium)  Here are some tips to help you make wise food choices when you are dining out.  Chose more often Avoid  Beverages   Choose  more often: Water, low fat milk  Sugar-free/diet drinks   Unsweet tea or coffee    Avoid: Milkshakes, fruit drinks, regular pop  Alcohol, specialty drinks (e.g. iced cappuccino)  Fast food  Choose more often:  Garden salad  Mini subs, pita sandwiches ect with extra vegetables  plain burgers, grilled chicken  Vegetarian or cheese pizza with whole-grain crust    Avoid: Burgers/sandwiches with bacon, cheese, and high-fat sauces  [place] fries, fried chicken, fried fish, poutine, hash browns  Pizza with processed meats  Starters   Choose more often: Raw vegetables, salads (garden, spinach, fruit)  clear or vegetable soups  Seafood cocktail  Whole-grain breads and rolls    Avoid: Salads with high-fat dressings or toppings  Creamy soups  Wings, egg rolls  onion rings, nachos  White or garlic bread  Main courses Grains & Starches (amount equal to  of your plate)  Choose more often:  Oatmeal, high-fiber/lower-sugar cereals  Whole-grain breads, rice, pasta, barley, couscous  Sweet potatoes    Avoid: Sugary, low-fiber cereals  Large bagels, muffins, croissants, white bread  [place] fries, hash browns, fried rice   Meat and alternative (amount equal to  of your plate)  Choose more often:  Lean meats, poultry, fish, eggs, low-fat cheese  Tofu, vegetable protein Legumes (e.g. lentils, chickpeas, beans)    Avoid: High-salt and/or high-fat meats (e.g. ribs, wings, sausages, wieners, processed lunch meats, imposter meats)   Vegetables (amount equal to  of your plate)  Choose more often:  Salads ([place], garden, spinach), plain vegetables   Avoid:  Salads with creamy, high-fat dressings and   Vegetables on sandwiches ect toppings like bacon bits, croutons, cheese  Desserts  Choose more often:  Fresh fruit, frozen yogourt, skim milk latte    Avoid: Cakes, pies, pastries, ice cream, cheesecake

## 2023-07-25 ENCOUNTER — Other Ambulatory Visit (HOSPITAL_COMMUNITY): Payer: Self-pay

## 2023-07-25 ENCOUNTER — Other Ambulatory Visit: Payer: Self-pay

## 2023-07-25 MED ORDER — SILDENAFIL CITRATE 100 MG PO TABS
100.0000 mg | ORAL_TABLET | Freq: Every day | ORAL | 1 refills | Status: DC | PRN
  Filled 2023-07-25: qty 10, 10d supply, fill #0
  Filled 2023-08-22 – 2023-10-02 (×2): qty 90, 90d supply, fill #0
  Filled 2023-11-26 – 2024-03-14 (×2): qty 90, 90d supply, fill #1

## 2023-08-06 ENCOUNTER — Other Ambulatory Visit (HOSPITAL_COMMUNITY): Payer: Self-pay

## 2023-08-07 ENCOUNTER — Other Ambulatory Visit (HOSPITAL_COMMUNITY): Payer: Self-pay

## 2023-08-07 MED ORDER — LEVOFLOXACIN 750 MG PO TABS
ORAL_TABLET | ORAL | 0 refills | Status: DC
Start: 2023-08-06 — End: 2024-04-11
  Filled 2023-08-07 – 2023-08-22 (×2): qty 1, 1d supply, fill #0

## 2023-08-11 ENCOUNTER — Encounter (HOSPITAL_COMMUNITY): Payer: Self-pay

## 2023-08-12 ENCOUNTER — Other Ambulatory Visit (HOSPITAL_COMMUNITY): Payer: Self-pay

## 2023-08-16 ENCOUNTER — Other Ambulatory Visit: Payer: Self-pay | Admitting: Internal Medicine

## 2023-08-16 DIAGNOSIS — R748 Abnormal levels of other serum enzymes: Secondary | ICD-10-CM

## 2023-08-17 ENCOUNTER — Other Ambulatory Visit (HOSPITAL_COMMUNITY): Payer: Self-pay

## 2023-08-22 ENCOUNTER — Other Ambulatory Visit (HOSPITAL_COMMUNITY): Payer: Self-pay

## 2023-08-22 ENCOUNTER — Inpatient Hospital Stay: Admission: RE | Admit: 2023-08-22 | Source: Ambulatory Visit

## 2023-08-23 ENCOUNTER — Other Ambulatory Visit: Payer: Self-pay

## 2023-08-28 ENCOUNTER — Ambulatory Visit
Admission: RE | Admit: 2023-08-28 | Discharge: 2023-08-28 | Disposition: A | Discharge status: Home / Self Care | Source: Ambulatory Visit | Attending: Internal Medicine | Admitting: Internal Medicine

## 2023-08-28 DIAGNOSIS — R748 Abnormal levels of other serum enzymes: Secondary | ICD-10-CM

## 2023-10-02 ENCOUNTER — Other Ambulatory Visit: Payer: Self-pay

## 2023-10-02 ENCOUNTER — Other Ambulatory Visit (HOSPITAL_COMMUNITY): Payer: Self-pay

## 2023-10-20 ENCOUNTER — Other Ambulatory Visit (HOSPITAL_COMMUNITY): Payer: Self-pay

## 2023-10-26 ENCOUNTER — Other Ambulatory Visit (HOSPITAL_COMMUNITY): Payer: Self-pay

## 2023-11-26 ENCOUNTER — Other Ambulatory Visit: Payer: Self-pay

## 2023-11-26 ENCOUNTER — Other Ambulatory Visit (HOSPITAL_COMMUNITY): Payer: Self-pay

## 2023-12-11 ENCOUNTER — Other Ambulatory Visit (HOSPITAL_COMMUNITY): Payer: Self-pay

## 2023-12-11 MED ORDER — ZEPBOUND 12.5 MG/0.5ML ~~LOC~~ SOAJ
12.5000 mg | SUBCUTANEOUS | 11 refills | Status: AC
  Filled 2023-12-11: qty 2, 28d supply, fill #0

## 2023-12-14 ENCOUNTER — Other Ambulatory Visit (HOSPITAL_COMMUNITY): Payer: Self-pay

## 2023-12-21 ENCOUNTER — Other Ambulatory Visit (HOSPITAL_COMMUNITY): Payer: Self-pay

## 2024-02-26 ENCOUNTER — Other Ambulatory Visit (HOSPITAL_COMMUNITY): Payer: Self-pay

## 2024-02-26 MED ORDER — ROSUVASTATIN CALCIUM 40 MG PO TABS
40.0000 mg | ORAL_TABLET | Freq: Every day | ORAL | 3 refills | Status: AC
  Filled 2024-02-26: qty 90, 90d supply, fill #0
  Filled 2024-04-14 – 2024-06-11 (×3): qty 90, 90d supply, fill #1

## 2024-02-26 MED ORDER — HYDROCORTISONE ACETATE 25 MG RE SUPP
RECTAL | 3 refills | Status: AC
  Filled 2024-02-26: qty 24, 12d supply, fill #0
  Filled 2024-04-14: qty 24, 12d supply, fill #1

## 2024-02-26 MED ORDER — EZETIMIBE 10 MG PO TABS
10.0000 mg | ORAL_TABLET | Freq: Every day | ORAL | 3 refills | Status: AC
  Filled 2024-02-26: qty 90, 90d supply, fill #0
  Filled 2024-04-19 – 2024-05-09 (×2): qty 90, 90d supply, fill #1
  Filled 2024-06-11: qty 90, 90d supply, fill #2

## 2024-02-26 MED ORDER — VITAMIN D (CHOLECALCIFEROL) 25 MCG (1000 UT) PO TABS
1.0000 | ORAL_TABLET | Freq: Every day | ORAL | 3 refills | Status: AC
  Filled 2024-02-26 – 2024-04-19 (×2): qty 100, 100d supply, fill #0

## 2024-02-26 MED ORDER — ZEPBOUND 5 MG/0.5ML ~~LOC~~ SOAJ
5.0000 mg | SUBCUTANEOUS | 11 refills | Status: AC
  Filled 2024-02-26: qty 2, 28d supply, fill #0

## 2024-02-26 MED ORDER — LEVOTHYROXINE SODIUM 50 MCG PO TABS
ORAL_TABLET | ORAL | 3 refills | Status: AC
  Filled 2024-02-26: qty 140, 90d supply, fill #0
  Filled 2024-04-14: qty 140, 90d supply, fill #1

## 2024-03-11 ENCOUNTER — Other Ambulatory Visit: Payer: Self-pay | Admitting: Urology

## 2024-03-14 ENCOUNTER — Other Ambulatory Visit (HOSPITAL_BASED_OUTPATIENT_CLINIC_OR_DEPARTMENT_OTHER): Payer: Self-pay

## 2024-03-14 MED ORDER — ZEPBOUND 7.5 MG/0.5ML ~~LOC~~ SOAJ
7.5000 mg | SUBCUTANEOUS | 11 refills | Status: AC
  Filled 2024-03-14 – 2024-03-20 (×2): qty 2, 28d supply, fill #0

## 2024-03-15 ENCOUNTER — Other Ambulatory Visit (HOSPITAL_COMMUNITY): Payer: Self-pay

## 2024-03-20 ENCOUNTER — Other Ambulatory Visit (HOSPITAL_BASED_OUTPATIENT_CLINIC_OR_DEPARTMENT_OTHER): Payer: Self-pay

## 2024-03-20 ENCOUNTER — Other Ambulatory Visit (HOSPITAL_COMMUNITY): Payer: Self-pay

## 2024-03-20 ENCOUNTER — Other Ambulatory Visit: Payer: Self-pay

## 2024-03-26 ENCOUNTER — Other Ambulatory Visit (HOSPITAL_COMMUNITY): Payer: Self-pay | Admitting: Urology

## 2024-03-26 DIAGNOSIS — C61 Malignant neoplasm of prostate: Secondary | ICD-10-CM

## 2024-04-01 NOTE — Patient Instructions (Signed)
 SURGICAL WAITING ROOM VISITATION  Patients having surgery or a procedure may have no more than 2 support people in the waiting area - these visitors may rotate.    Children under the age of 27 must have an adult with them who is not the patient.  Visitors with respiratory illnesses are discouraged from visiting and should remain at home.  If the patient needs to stay at the hospital during part of their recovery, the visitor guidelines for inpatient rooms apply. Pre-op nurse will coordinate an appropriate time for 1 support person to accompany patient in pre-op.  This support person may not rotate.    Please refer to the [facility] website for the visitor guidelines for Inpatients (after your surgery is over and you are in a regular room).       Your procedure is scheduled on: 04/11/2024    Report to [facility] Main Entrance    Report to admitting at   0830AM   Call this number if you have problems the morning of surgery [phone]   Do not eat food  or drink liquids :After Midnight.             Fleets enema nite before surgery                             If you have questions, please contact your surgeon's office.   FOLLOW BOWEL PREP AND ANY ADDITIONAL PRE OP INSTRUCTIONS YOU RECEIVED FROM YOUR SURGEON'S OFFICE!!!     Oral Hygiene is also important to reduce your risk of infection.                                    Remember - BRUSH YOUR TEETH THE MORNING OF SURGERY WITH YOUR REGULAR TOOTHPASTE  DENTURES WILL BE REMOVED PRIOR TO SURGERY PLEASE DO NOT APPLY Poly grip OR ADHESIVES!!!   Do NOT smoke after Midnight   Stop all vitamins and herbal supplements 7 days before surgery.   Take these medicines the morning of surgery with A SIP OF WATER: Synthroid            Zepbound - last dose on   DO NOT TAKE ANY ORAL DIABETIC MEDICATIONS DAY OF YOUR SURGERY  Bring CPAP mask and tubing day of surgery.                              You may not have any metal  on your body including hair pins, jewelry, and body piercing             Do not wear make-up, lotions, powders, perfumes/cologne, or deodorant  Do not wear nail polish including gel and S&S, artificial/acrylic nails, or any other type of covering on natural nails including finger and toenails. If you have artificial nails, gel coating, etc. that needs to be removed by a nail salon please have this removed prior to surgery or surgery may need to be canceled/ delayed if the surgeon/ anesthesia feels like they are unable to be safely monitored.   Do not shave  48 hours prior to surgery.               Men may shave face and neck.   Do not bring valuables to the hospital. [facility] IS NOT  RESPONSIBLE   FOR VALUABLES.   Contacts, glasses, dentures or bridgework may not be worn into surgery.   Bring small overnight bag day of surgery.   DO NOT BRING YOUR HOME MEDICATIONS TO THE HOSPITAL. PHARMACY WILL DISPENSE MEDICATIONS LISTED ON YOUR MEDICATION LIST TO YOU DURING YOUR ADMISSION IN THE HOSPITAL!    Patients discharged on the day of surgery will not be allowed to drive home.  Someone NEEDS to stay with you for the first 24 hours after anesthesia.   Special Instructions: Bring a copy of your healthcare power of attorney and living will documents the day of surgery if you haven't scanned them before.              Please read over the following fact sheets you were given: IF YOU HAVE QUESTIONS ABOUT YOUR PRE-OP INSTRUCTIONS PLEASE CALL [phone].   If you received a COVID test during your pre-op visit  it is requested that you wear a mask when out in public, stay away from anyone that may not be feeling well and notify your surgeon if you develop symptoms. If you test positive for Covid or have been in contact with anyone that has tested positive in the last 10 days please notify you surgeon.    [facility] - Preparing for Surgery Before surgery, you can play an important role.   Because skin is not sterile, your skin needs to be as free of germs as possible.  You can reduce the number of germs on your skin by washing with CHG (chlorahexidine gluconate) soap before surgery.  CHG is an antiseptic cleaner which kills germs and bonds with the skin to continue killing germs even after washing. Please DO NOT use if you have an allergy to CHG or antibacterial soaps.  If your skin becomes reddened/irritated stop using the CHG and inform your nurse when you arrive at Short Stay. Do not shave (including legs and underarms) for at least 48 hours prior to the first CHG shower.  You may shave your face/neck.  Please follow these instructions carefully:  1.  Shower with CHG Soap the night before surgery ONLY (DO NOT USE THE SOAP THE MORNING OF SURGERY).  2.  If you choose to wash your hair, wash your hair first as usual with your normal  shampoo.  3.  After you shampoo, rinse your hair and body thoroughly to remove the shampoo.                             4.  Use CHG as you would any other liquid soap.  You can apply chg directly to the skin and wash.  Gently with a scrungie or clean washcloth.  5.  Apply the CHG Soap to your body ONLY FROM THE NECK DOWN.   Do   not use on face/ open                           Wound or open sores. Avoid contact with eyes, ears mouth and   genitals (private parts).                       Wash face,  Genitals (private parts) with your normal soap.             6.  Wash thoroughly, paying special attention to the area where your    surgery  will be performed.  7.  Thoroughly rinse your body with warm water from the neck down.  8.  DO NOT shower/wash with your normal soap after using and rinsing off the CHG Soap.                9.  Pat yourself dry with a clean towel.            10.  Wear clean pajamas.            11.  Place clean sheets on your bed the night of your first shower and do not  sleep with pets. Day of Surgery : Do not apply any CHG,  lotions/deodorants the morning of surgery.  Please wear clean clothes to the hospital/surgery center.  FAILURE TO FOLLOW THESE INSTRUCTIONS MAY RESULT IN THE CANCELLATION OF YOUR SURGERY  PATIENT SIGNATURE_________________________________  NURSE SIGNATURE__________________________________  ________________________________________________________________________

## 2024-04-01 NOTE — Progress Notes (Signed)
 Anesthesia Review:  PCP: Cardiologist : [facility] 07/23/23   PPM/ ICD: Device Orders: Rep Notified:  Chest x-ray : EKG : 07/23/23  CT Card-2021  Echo : Stress test: Cardiac Cath :   Activity level:  Sleep Study/ CPAP : Fasting Blood Sugar :      / Checks Blood Sugar -- times a day:    Blood Thinner/ Instructions /Last Dose: ASA / Instructions/ Last Dose :

## 2024-04-03 ENCOUNTER — Other Ambulatory Visit: Payer: Self-pay

## 2024-04-03 ENCOUNTER — Encounter (HOSPITAL_COMMUNITY): Payer: Self-pay

## 2024-04-03 ENCOUNTER — Encounter (HOSPITAL_COMMUNITY)
Admission: RE | Admit: 2024-04-03 | Discharge: 2024-04-03 | Disposition: A | Discharge status: Home / Self Care | Source: Ambulatory Visit | Attending: Urology | Admitting: Urology

## 2024-04-03 VITALS — BP 113/71 | HR 62 | Temp 98.1°F | Resp 16 | Ht 73.0 in | Wt 220.0 lb

## 2024-04-03 DIAGNOSIS — Z01818 Encounter for other preprocedural examination: Secondary | ICD-10-CM | POA: Diagnosis present

## 2024-04-03 DIAGNOSIS — N401 Enlarged prostate with lower urinary tract symptoms: Secondary | ICD-10-CM | POA: Insufficient documentation

## 2024-04-03 DIAGNOSIS — Z87891 Personal history of nicotine dependence: Secondary | ICD-10-CM | POA: Diagnosis not present

## 2024-04-03 DIAGNOSIS — E039 Hypothyroidism, unspecified: Secondary | ICD-10-CM | POA: Insufficient documentation

## 2024-04-03 DIAGNOSIS — Z79890 Hormone replacement therapy (postmenopausal): Secondary | ICD-10-CM | POA: Insufficient documentation

## 2024-04-03 DIAGNOSIS — N138 Other obstructive and reflux uropathy: Secondary | ICD-10-CM | POA: Insufficient documentation

## 2024-04-03 DIAGNOSIS — Z01812 Encounter for preprocedural laboratory examination: Secondary | ICD-10-CM | POA: Diagnosis not present

## 2024-04-03 DIAGNOSIS — G4730 Sleep apnea, unspecified: Secondary | ICD-10-CM | POA: Diagnosis not present

## 2024-04-03 DIAGNOSIS — Z79899 Other long term (current) drug therapy: Secondary | ICD-10-CM | POA: Insufficient documentation

## 2024-04-03 HISTORY — DX: Sleep apnea, unspecified: G47.30

## 2024-04-03 HISTORY — DX: Hypothyroidism, unspecified: E03.9

## 2024-04-03 HISTORY — DX: Prediabetes: R73.03

## 2024-04-03 HISTORY — DX: Nausea with vomiting, unspecified: R11.2

## 2024-04-03 LAB — BASIC METABOLIC PANEL WITH GFR
Anion gap: 10 (ref 5–15)
BUN: 16 mg/dL (ref 6–20)
CO2: 24 mmol/L (ref 22–32)
Calcium: 9.4 mg/dL (ref 8.9–10.3)
Chloride: 103 mmol/L (ref 98–111)
Creatinine, Ser: 1.16 mg/dL (ref 0.61–1.24)
GFR, Estimated: >60 mL/min (ref >60)
Glucose, Bld: 98 mg/dL (ref 70–99)
Potassium: 4.3 mmol/L (ref 3.5–5.1)
Sodium: 138 mmol/L (ref 135–145)

## 2024-04-03 LAB — CBC
HCT: 47.8 % (ref 39.0–52.0)
Hemoglobin: 15.6 g/dL (ref 13.0–17.0)
MCH: 28.2 pg (ref 26.0–34.0)
MCHC: 32.6 g/dL (ref 30.0–36.0)
MCV: 86.4 fL (ref 80.0–100.0)
Platelets: 228 K/uL (ref 150–400)
RBC: 5.53 MIL/uL (ref 4.22–5.81)
RDW: 12.4 % (ref 11.5–15.5)
WBC: 6.2 K/uL (ref 4.0–10.5)
nRBC: 0 % (ref 0.0–0.2)

## 2024-04-04 NOTE — Progress Notes (Signed)
 Anesthesia Chart Review   Case: [id number] Date/Time: 04/11/24 1015   Procedure: ABLATION, PROSTATE, TRANSURETHRAL, USING WATERJET   Anesthesia type: General   Diagnosis: Enlarged prostate with urinary obstruction [N40.1, N13.8]   Pre-op diagnosis: BENIGN PROSTATIC HYPERPLASIA   Location: [facility] PROCEDURE [facility] / [facility]   Surgeons: Robert Cough, MD       DISCUSSION:58 y.o. former smoker with h/o PONV, sleep apnea does not use CPAP, hypothyroidism, BPH scheduled for above procedure 04/11/2024 with Dr. Cough Robert.   Pt last seen by cardiology 07/23/23. Per OV note, CT calcium score 12/30/2019 with total Agatston score 239 (88th percentile) with bulk of calcium in LAD.  He is very active with regular cardiovascular and weight training exercise and denies chest pain, dyspnea, or other symptoms concerning for angina.  No indication for further ischemic evaluation at this time.  EKG today is unremarkable. 1 year follow up recommended.   Pt advised to hold Zepbound  1 week prior to procedure.  VS: BP 113/71   Pulse 62   Temp 36.7 C (Oral)   Resp 16   Ht 6' 1 (1.854 m)   Wt 99.8 kg   SpO2 98%   BMI 29.03 kg/m   PROVIDERS: Shayne Anes, MD is PCP   Cardiologist:  Stanly DELENA Leavens, MD   LABS: Labs reviewed: Acceptable for surgery. (all labs ordered are listed, but only abnormal results are displayed)  Labs Reviewed  BASIC METABOLIC PANEL WITH GFR  CBC     IMAGES:   EKG:   CV:  Past Medical History:  Diagnosis Date   BPH (benign prostatic hyperplasia)    Cancer (HCC)    pre-melanoma skin cancer   DDD (degenerative disc disease), lumbar    GERD (gastroesophageal reflux disease)    Hemorrhoids    HLD (hyperlipidemia)    Hypothyroidism    Melanoma (HCC)    PONV (postoperative nausea and vomiting)    Pre-diabetes    Sleep apnea    does not use cpap    Past Surgical History:  Procedure Laterality Date   HERNIA REPAIR Left 2019   left inguinal hernia  repair   MICRODISCECTOMY LUMBAR  06/26/2018   L4-L5   VASECTOMY      MEDICATIONS:  aspirin  EC 81 MG tablet   cholecalciferol (VITAMIN D3) 25 MCG (1000 UNIT) tablet   ezetimibe  (ZETIA ) 10 MG tablet   ezetimibe  (ZETIA ) 10 MG tablet   hydrocortisone (ANUSOL-HC) 25 MG suppository   ibuprofen (ADVIL) 200 MG tablet   levofloxacin  (LEVAQUIN ) 750 MG tablet   levothyroxine (SYNTHROID) 50 MCG tablet   Omega-3 300 MG CAPS   rosuvastatin (CRESTOR) 40 MG tablet   sildenafil  (VIAGRA ) 100 MG tablet   tirzepatide  (ZEPBOUND ) 10 MG/0.5ML Pen   tirzepatide  (ZEPBOUND ) 12.5 MG/0.5ML Pen   tirzepatide  (ZEPBOUND ) 5 MG/0.5ML Pen   tirzepatide  (ZEPBOUND ) 7.5 MG/0.5ML Pen   Vitamin D, Cholecalciferol, 25 MCG (1000 UT) CAPS   No current facility-administered medications for this encounter.      Harlene Hoots Ward, PA-C [facility] [phone]

## 2024-04-07 ENCOUNTER — Encounter (HOSPITAL_COMMUNITY)
Admission: RE | Admit: 2024-04-07 | Discharge: 2024-04-07 | Disposition: A | Discharge status: Home / Self Care | Source: Ambulatory Visit | Attending: Urology | Admitting: Urology

## 2024-04-07 DIAGNOSIS — C61 Malignant neoplasm of prostate: Secondary | ICD-10-CM | POA: Insufficient documentation

## 2024-04-07 MED ORDER — FLOTUFOLASTAT F 18 GALLIUM 296-5846 MBQ/ML IV SOLN
8.8000 | Freq: Once | INTRAVENOUS | Status: AC
  Administered 2024-04-07: 8.8 via INTRAVENOUS

## 2024-04-11 ENCOUNTER — Encounter (HOSPITAL_COMMUNITY)
Admission: RE | Disposition: A | Discharge status: Home / Self Care | Payer: Self-pay | Source: Ambulatory Visit | Attending: Urology

## 2024-04-11 ENCOUNTER — Other Ambulatory Visit (HOSPITAL_COMMUNITY): Payer: Self-pay

## 2024-04-11 ENCOUNTER — Ambulatory Visit (HOSPITAL_COMMUNITY): Payer: Self-pay | Admitting: Physician Assistant

## 2024-04-11 ENCOUNTER — Ambulatory Visit (HOSPITAL_COMMUNITY)
Admission: RE | Admit: 2024-04-11 | Discharge: 2024-04-11 | Disposition: A | Discharge status: Home / Self Care | Source: Ambulatory Visit | Attending: Urology | Admitting: Urology

## 2024-04-11 ENCOUNTER — Ambulatory Visit (HOSPITAL_COMMUNITY): Admitting: Anesthesiology

## 2024-04-11 ENCOUNTER — Other Ambulatory Visit: Payer: Self-pay

## 2024-04-11 ENCOUNTER — Encounter (HOSPITAL_COMMUNITY): Payer: Self-pay | Admitting: Urology

## 2024-04-11 DIAGNOSIS — G4730 Sleep apnea, unspecified: Secondary | ICD-10-CM | POA: Insufficient documentation

## 2024-04-11 DIAGNOSIS — C61 Malignant neoplasm of prostate: Secondary | ICD-10-CM | POA: Diagnosis not present

## 2024-04-11 DIAGNOSIS — R338 Other retention of urine: Secondary | ICD-10-CM | POA: Insufficient documentation

## 2024-04-11 DIAGNOSIS — R3912 Poor urinary stream: Secondary | ICD-10-CM | POA: Diagnosis not present

## 2024-04-11 DIAGNOSIS — Z87891 Personal history of nicotine dependence: Secondary | ICD-10-CM | POA: Insufficient documentation

## 2024-04-11 DIAGNOSIS — R350 Frequency of micturition: Secondary | ICD-10-CM

## 2024-04-11 DIAGNOSIS — I2510 Atherosclerotic heart disease of native coronary artery without angina pectoris: Secondary | ICD-10-CM | POA: Diagnosis not present

## 2024-04-11 DIAGNOSIS — Z01818 Encounter for other preprocedural examination: Secondary | ICD-10-CM

## 2024-04-11 DIAGNOSIS — N401 Enlarged prostate with lower urinary tract symptoms: Secondary | ICD-10-CM

## 2024-04-11 DIAGNOSIS — E039 Hypothyroidism, unspecified: Secondary | ICD-10-CM

## 2024-04-11 SURGERY — ABLATION, PROSTATE, TRANSURETHRAL, USING WATERJET
Anesthesia: General

## 2024-04-11 MED ORDER — SUGAMMADEX SODIUM 200 MG/2ML IV SOLN
INTRAVENOUS | Status: DC | PRN
  Administered 2024-04-11: 200 mg via INTRAVENOUS

## 2024-04-11 MED ORDER — TRANEXAMIC ACID-NACL 1000-0.7 MG/100ML-% IV SOLN
INTRAVENOUS | Status: DC | PRN
  Administered 2024-04-11: 1000 mg via INTRAVENOUS

## 2024-04-11 MED ORDER — ONDANSETRON HCL 4 MG/2ML IJ SOLN
INTRAMUSCULAR | Status: DC | PRN
  Administered 2024-04-11: 4 mg via INTRAVENOUS

## 2024-04-11 MED ORDER — TRANEXAMIC ACID-NACL 1000-0.7 MG/100ML-% IV SOLN
INTRAVENOUS | Status: AC
  Filled 2024-04-11: qty 100

## 2024-04-11 MED ORDER — ONDANSETRON HCL 4 MG/2ML IJ SOLN
INTRAMUSCULAR | Status: AC
  Filled 2024-04-11: qty 2

## 2024-04-11 MED ORDER — 0.9 % SODIUM CHLORIDE (POUR BTL) OPTIME
TOPICAL | Status: DC | PRN
  Administered 2024-04-11: 1000 mL

## 2024-04-11 MED ORDER — PROPOFOL 10 MG/ML IV BOLUS
INTRAVENOUS | Status: DC | PRN
  Administered 2024-04-11: 200 mg via INTRAVENOUS

## 2024-04-11 MED ORDER — STERILE WATER FOR IRRIGATION IR SOLN
Status: DC | PRN
  Administered 2024-04-11: 10 mL

## 2024-04-11 MED ORDER — DOXYCYCLINE HYCLATE 100 MG PO CAPS
100.0000 mg | ORAL_CAPSULE | Freq: Two times a day (BID) | ORAL | 0 refills | Status: AC
  Filled 2024-04-11: qty 7, 4d supply, fill #0

## 2024-04-11 MED ORDER — FENTANYL CITRATE (PF) 50 MCG/ML IJ SOSY: 25.0000 ug | PREFILLED_SYRINGE | INTRAMUSCULAR | Status: DC | PRN

## 2024-04-11 MED ORDER — MIDAZOLAM HCL 5 MG/5ML IJ SOLN
INTRAMUSCULAR | Status: DC | PRN
  Administered 2024-04-11: 2 mg via INTRAVENOUS

## 2024-04-11 MED ORDER — OXYCODONE HCL 5 MG PO TABS
ORAL_TABLET | ORAL | Status: AC
  Filled 2024-04-11: qty 1

## 2024-04-11 MED ORDER — ROCURONIUM BROMIDE 10 MG/ML (PF) SYRINGE
PREFILLED_SYRINGE | INTRAVENOUS | Status: DC | PRN
  Administered 2024-04-11: 60 mg via INTRAVENOUS

## 2024-04-11 MED ORDER — FENTANYL CITRATE (PF) 100 MCG/2ML IJ SOLN
INTRAMUSCULAR | Status: DC | PRN
  Administered 2024-04-11: 50 ug via INTRAVENOUS
  Administered 2024-04-11: 100 ug via INTRAVENOUS
  Administered 2024-04-11: 50 ug via INTRAVENOUS

## 2024-04-11 MED ORDER — ROCURONIUM BROMIDE 10 MG/ML (PF) SYRINGE
PREFILLED_SYRINGE | INTRAVENOUS | Status: AC
  Filled 2024-04-11: qty 10

## 2024-04-11 MED ORDER — FENTANYL CITRATE (PF) 100 MCG/2ML IJ SOLN
INTRAMUSCULAR | Status: AC
  Filled 2024-04-11: qty 2

## 2024-04-11 MED ORDER — PROPOFOL 10 MG/ML IV BOLUS
INTRAVENOUS | Status: AC
  Filled 2024-04-11: qty 20

## 2024-04-11 MED ORDER — CEFAZOLIN SODIUM-DEXTROSE 2-4 GM/100ML-% IV SOLN
2.0000 g | INTRAVENOUS | Status: AC
  Administered 2024-04-11: 2 g via INTRAVENOUS
  Filled 2024-04-11: qty 100

## 2024-04-11 MED ORDER — OXYCODONE HCL 5 MG PO TABS
5.0000 mg | ORAL_TABLET | Freq: Once | ORAL | Status: AC | PRN
  Administered 2024-04-11: 5 mg via ORAL

## 2024-04-11 MED ORDER — SUGAMMADEX SODIUM 200 MG/2ML IV SOLN
INTRAVENOUS | Status: AC
  Filled 2024-04-11: qty 2

## 2024-04-11 MED ORDER — MIDAZOLAM HCL 2 MG/2ML IJ SOLN
INTRAMUSCULAR | Status: AC
  Filled 2024-04-11: qty 2

## 2024-04-11 MED ORDER — OXYBUTYNIN CHLORIDE 5 MG PO TABS
5.0000 mg | ORAL_TABLET | Freq: Three times a day (TID) | ORAL | 0 refills | Status: AC | PRN
  Filled 2024-04-11: qty 15, 5d supply, fill #0

## 2024-04-11 MED ORDER — OXYCODONE HCL 5 MG/5ML PO SOLN: 5.0000 mg | Freq: Once | ORAL | Status: AC | PRN

## 2024-04-11 MED ORDER — CHLORHEXIDINE GLUCONATE 0.12 % MT SOLN
15.0000 mL | Freq: Once | OROMUCOSAL | Status: AC
  Administered 2024-04-11: 15 mL via OROMUCOSAL

## 2024-04-11 MED ORDER — LIDOCAINE HCL (PF) 2 % IJ SOLN
INTRAMUSCULAR | Status: DC | PRN
  Administered 2024-04-11: 100 mg via INTRADERMAL

## 2024-04-11 MED ORDER — SODIUM CHLORIDE 0.9 % IR SOLN
Status: DC | PRN
  Administered 2024-04-11: 12000 mL

## 2024-04-11 MED ORDER — PHENYLEPHRINE 80 MCG/ML (10ML) SYRINGE FOR IV PUSH (FOR BLOOD PRESSURE SUPPORT)
PREFILLED_SYRINGE | INTRAVENOUS | Status: DC | PRN
  Administered 2024-04-11: 160 ug via INTRAVENOUS

## 2024-04-11 MED ORDER — DROPERIDOL 2.5 MG/ML IJ SOLN: 0.6250 mg | Freq: Once | INTRAMUSCULAR | Status: DC | PRN

## 2024-04-11 MED ORDER — LIDOCAINE HCL URETHRAL/MUCOSAL 2 % EX GEL
CUTANEOUS | Status: AC
  Filled 2024-04-11: qty 30

## 2024-04-11 MED ORDER — LACTATED RINGERS IV SOLN: INTRAVENOUS | Status: DC

## 2024-04-11 MED ORDER — ACETAMINOPHEN 10 MG/ML IV SOLN: 1000.0000 mg | Freq: Once | INTRAVENOUS | Status: DC | PRN

## 2024-04-11 MED ORDER — ORAL CARE MOUTH RINSE: 15.0000 mL | Freq: Once | OROMUCOSAL | Status: AC

## 2024-04-11 MED ORDER — LIDOCAINE HCL (PF) 2 % IJ SOLN
INTRAMUSCULAR | Status: AC
  Filled 2024-04-11: qty 5

## 2024-04-11 MED ORDER — DEXAMETHASONE SOD PHOSPHATE PF 10 MG/ML IJ SOLN
INTRAMUSCULAR | Status: DC | PRN
Start: 2024-04-11 — End: 2024-04-11
  Administered 2024-04-11: 10 mg via INTRAVENOUS

## 2024-04-11 SURGICAL SUPPLY — 27 items
BAG URINE DRAIN 2000ML AR STRL (UROLOGICAL SUPPLIES) ×1 IMPLANT
BAND RUBBER #18 3X1/16 STRL (MISCELLANEOUS) IMPLANT
CATH HEMA 3WAY 30CC 22FR COUDE (CATHETERS) IMPLANT
CATH HEMA 3WAY 30CC 24FR COUDE (CATHETERS) IMPLANT
CATH HEMATURIA 20FR (CATHETERS) IMPLANT
COVER MAYO STAND STRL (DRAPES) ×1 IMPLANT
DRAPE FOOT SWITCH (DRAPES) ×1 IMPLANT
DRAPE SURG IRRIG POUCH 19X23 (DRAPES) IMPLANT
GEL ULTRASOUND 8.5O AQUASONIC (MISCELLANEOUS) ×1 IMPLANT
GLOVE SURG LX STRL 7.5 STRW (GLOVE) ×1 IMPLANT
GOWN STRL REUS W/ TWL XL LVL3 (GOWN DISPOSABLE) ×1 IMPLANT
HANDPIECE AQUABEAM (MISCELLANEOUS) ×1 IMPLANT
HOLDER FOLEY CATH W/STRAP (MISCELLANEOUS) IMPLANT
KIT TURNOVER KIT A (KITS) ×1 IMPLANT
LOOP CUT BIPOLAR 24F LRG (ELECTROSURGICAL) IMPLANT
MANIFOLD NEPTUNE II (INSTRUMENTS) ×1 IMPLANT
MAT ABSORB FLUID 56X50 GRAY (MISCELLANEOUS) ×2 IMPLANT
PACK CYSTO (CUSTOM PROCEDURE TRAY) ×1 IMPLANT
PACK DRAPE AQUABEAM (MISCELLANEOUS) ×1 IMPLANT
PAD PREP 24X48 CUFFED NSTRL (MISCELLANEOUS) ×1 IMPLANT
PIN SAFETY STERILE (MISCELLANEOUS) IMPLANT
SYR 30ML LL (SYRINGE) ×1 IMPLANT
SYRINGE TOOMEY IRRIG 70ML (MISCELLANEOUS) ×2 IMPLANT
TOWEL OR 17X26 10 PK STRL BLUE (TOWEL DISPOSABLE) ×1 IMPLANT
TUBING CONNECTING 10 (TUBING) ×2 IMPLANT
TUBING UROLOGY SET (TUBING) ×1 IMPLANT
UNDERPAD 30X36 HEAVY ABSORB (UNDERPADS AND DIAPERS) ×1 IMPLANT

## 2024-04-11 NOTE — Anesthesia Preprocedure Evaluation (Signed)
 Anesthesia Evaluation  Patient identified by MRN, date of birth, ID band Patient awake    Reviewed: Allergy & Precautions, H&P , NPO status , Patient's Chart, lab work & pertinent test results  History of Anesthesia Complications (+) PONV and history of anesthetic complications  Airway Mallampati: II  TM Distance: >3 FB Neck ROM: Full    Dental no notable dental hx.    Pulmonary sleep apnea , former smoker   Pulmonary exam normal breath sounds clear to auscultation       Cardiovascular (-) hypertension(-) angina + CAD  (-) Past MI Normal cardiovascular exam Rhythm:Regular Rate:Normal     Neuro/Psych neg Seizures negative neurological ROS  negative psych ROS   GI/Hepatic Neg liver ROS,GERD  ,,  Endo/Other  Hypothyroidism    Renal/GU negative Renal ROS   Enlarged prostate with urinary obstruction    Musculoskeletal negative musculoskeletal ROS (+)    Abdominal   Peds negative pediatric ROS (+)  Hematology negative hematology ROS (+)   Anesthesia Other Findings   Reproductive/Obstetrics negative OB ROS                              Anesthesia Physical Anesthesia Plan  ASA: 3  Anesthesia Plan: General   Post-op Pain Management: Tylenol PO (pre-op)*   Induction: Intravenous  PONV Risk Score and Plan: 3 and Ondansetron, Dexamethasone, Midazolam and Treatment may vary due to age or medical condition  Airway Management Planned: Oral ETT  Additional Equipment: None  Intra-op Plan:   Post-operative Plan: Extubation in OR  Informed Consent: I have reviewed the patients History and Physical, chart, labs and discussed the procedure including the risks, benefits and alternatives for the proposed anesthesia with the patient or authorized representative who has indicated his/her understanding and acceptance.     Dental advisory given  Plan Discussed with: CRNA  Anesthesia Plan  Comments:         Anesthesia Quick Evaluation

## 2024-04-11 NOTE — Anesthesia Procedure Notes (Addendum)
 Procedure Name: Intubation Date/Time: 04/11/2024 9:35 AM  Performed by: Carleton Garnette SAUNDERS, CRNAPre-anesthesia Checklist: Patient identified, Emergency Drugs available, Suction available, Patient being monitored and Timeout performed Patient Re-evaluated:Patient Re-evaluated prior to induction Oxygen Delivery Method: Circle system utilized Preoxygenation: Pre-oxygenation with 100% oxygen Induction Type: IV induction Ventilation: Mask ventilation without difficulty Laryngoscope Size: Mac and 4 Grade View: Grade I Tube type: Oral Tube size: 7.5 mm Number of attempts: 1 Airway Equipment and Method: Stylet Placement Confirmation: ETT inserted through vocal cords under direct vision, positive ETCO2 and breath sounds checked- equal and bilateral Secured at: 23 cm Tube secured with: Tape Dental Injury: Teeth and Oropharynx as per pre-operative assessment

## 2024-04-11 NOTE — Op Note (Addendum)
 Preoperative diagnosis: BPH with lower urinary tract symptoms, weak stream, frequency  Postoperative diagnosis: Same   Procedure: Robotic water jet ablation of the prostate   Surgeon: Bladyn Tipps   Anesthesia: General   Indication for procedure: 58 year old male with symptomatic BPH.  Findings:  On exam the penis was circumcised without mass or lesion.  The glans and meatus appeared normal.  On DRE the prostate was about 40 g and smooth without hard area or nodule.  Cystoscopy revealed obstructing lateral lobe and he did have about 10 mm of IPP.  Ureteral orifices were normal before and after any ablation or resection.  No stone or foreign body in the bladder.  No mucosal lesions.  Description of procedure:  He was brought to the operating room and placed supine on the operating table.  After adequate anesthesia he was placed lithotomy position. Timeout was performed to confirm the patient and procedure. The TRUS Stepper was mounted to the Articulating Arm and secured to OR bed. The ultrasound probe was attached to the stepper. Exam under anesthesia was performed and the TRUS was inserted per rectum.  There was no resistance. The ultrasound probe was aligned, and confirmation made that the prostate is centered and aligned using both transverse and sagittal views. The bladder neck, verumontanum and the central/transition zones were identified.  Genitalia were prepped and draped in the usual sterile fashion. The 43F AQUABEAM Handpiece is inserted into the prostatic urethra and a complete cystoscopic evaluation was performed by inspecting the prostate, bladder, and identifying the location of the verumontanum/external sphincter. The AQUABEAM Handpiece was secured to the Handpiece Articulating Arm. Confirmed alignment of AQUABEAM Handpiece and TRUS Probe to be parallel and colinear. Confirmation that AQUABEAM nozzle is centered and anterior of the bladder neck or the median lobe. The cystoscope was then  retracted to visualize the verumontanum and external sphincter and the cystoscope tip was positioned just proximal to the external sphincter. Reconfirmed alignment of the TRUS probe with the AQUABEAM Handpiece and compression applied with TRUS probe. Horizontal alignment of the Handpiece waterjet nozzle was performed. The Aquablation treatment zones were planned utilizing real-time TRUS to visualize the contour of the prostate and the depth and radial angles of resection were defined in the transverse view. In the sagittal view, the AQUABEAM nozzle is identified and position registered with software. The treatment contours were then adjusted to conform to the intended resection margins. The median lobe, bladder neck and verumontanum were marked and confirmed in the treatment contour. The Aquablation Treatment was then started following the resection contour confirmed under ultrasound guidance.  TOTAL AQUABLATION RESECTION TIME: pass 1 3:49; Pass 2 3:57   Once Aquablation resection was complete the 24 French aqua beam handpiece was carefully removed.  The continuous-flow sheath with the visual obturator was passed and then the loop and handle.  The trigone and the ureteral orifices were identified.  Resection of some of the residual median lobe and bladder neck tissue was done.  The bladder neck was identified at 6:00 and this was taken up to 12:00 with fulguration of the bladder neck and prostate for hemostasis.  Slight amount of anterior tissue was resected.  Similarly from 6:00 up to 12:00 on the left side of the bladder neck was identified by resecting some of the ablated tissue to identify the bladder neck and cauterize any bleeding.  Some anterior tissue on the left was resected.  This created excellent hemostasis.  All the chips were evacuated.  Ureteral orifices again identified  and noted to be normal without injury.  The scope was backed out and a 20 French hematuria catheter was placed with 30 cc in the  balloon.  The balloon was seated at the bladder neck and it was irrigated on light traction and noted to be clear to pink.  He was hooked up to CBI.  He was cleaned up and placed supine.  Catheter was placed on traction.  He was awakened and taken to the cover room in stable condition.  Complications: None  Blood loss: 75 mL  Specimens: TURP chips  Drains: 20 French three-way hematuria catheter with 30 cc in the balloon  Disposition: Patient stable to PACU

## 2024-04-11 NOTE — Interval H&P Note (Signed)
 History and Physical Interval Note:  04/11/2024 9:16 AM  Robert Nichols Ruth  has presented today for surgery, with the diagnosis of BENIGN PROSTATIC HYPERPLASIA.  The various methods of treatment have been discussed with the patient and family. After consideration of risks, benefits and other options for treatment, the patient has consented to  Procedure(s): ABLATION, PROSTATE, TRANSURETHRAL, USING WATERJET (N/A) as a surgical intervention.  The patient's history has been reviewed, patient examined, no change in status, stable for surgery.  I have reviewed the patient's chart and labs.His PET scan was negative. He did cycle before last PSA. CT showed a benign prostate with no inflammation or abscess. No dysuria or fever. No congestion. Discussed the kidney stone and reviewed CT. Discussed the nature r/b/a to left ESWL and we will consider that once he recovers. Also discussed URS/HLL and surveillance. As far as BPH procedure discussed need for continued PCa surveillance.  Will attempt to preserve. Discussed flow vs storage symptoms. Questions were answered to the patient's satisfaction.     Donnice Brooks

## 2024-04-11 NOTE — Discharge Instructions (Signed)
 Robotic water  jet ablation of the Prostate, Care After The following information offers guidance on how to care for yourself after your procedure. Your health care provider may also give you more specific instructions. If you have problems or questions, contact your health care provider. What can I expect after the procedure? After the procedure, it is common to have: Mild pain in your lower abdomen. Soreness or mild discomfort in your penis or when you urinate. This is from having the catheter inserted during the procedure. A sudden urge to urinate (urgency). A need to urinate often. A small amount of blood in your urine. You may notice some small blood clots in your urine. These are normal. Follow these instructions at home: Medicines Take over-the-counter and prescription medicines only as told by your health care provider. If you were prescribed an antibiotic medicine, take it as told by your health care provider. Do not stop taking the antibiotic even if you start to feel better. Activity  Rest as told by your health care provider. Avoid sitting for a long time without moving. Get up to take short walks every 1-2 hours. This is important to improve blood flow and breathing. Ask for help if you feel weak or unsteady. You may increase your physical activity gradually as you start to feel better. Do not drive or operate machinery until your health care provider says that it is safe. Do not ride in a car for long periods of time, or as told by your health care provider. Avoid intense physical activity for as long as told by your health care provider. Do not lift anything that is heavier than 10 lb (4.5 kg), or the limit that you are told, until your health care provider says that it is safe. Do not have sex until your health care provider approves. Return to your normal activities as told by your health care provider. Ask your health care provider what activities are safe for you. Preventing  constipation You may need to take these actions to prevent or treat constipation: Drink enough fluid to keep your urine pale yellow. Take over-the-counter or prescription medicines. Eat foods that are high in fiber, such as beans, whole grains, and fresh fruits and vegetables. Limit foods that are high in fat and processed sugars, such as fried or sweet foods.   General instructions Do not strain when you have a bowel movement. Straining may lead to bleeding from the prostate. This may cause blood clots and trouble urinating. Do not use any products that contain nicotine or tobacco. These products include cigarettes, chewing tobacco, and vaping devices, such as e-cigarettes. If you need help quitting, ask your health care provider. If you go home with a tube draining your urine (urinary catheter), care for the catheter as told by your health care provider. Wear compression stockings as told by your health care provider. These stockings help to prevent blood clots and reduce swelling in your legs. Keep all follow-up visits. This is important. Contact a health care provider if: You have signs of infection, such as: Fever or chills. Urine that smells very bad. Swelling around your urethra that is getting worse. Swelling in your penis or testicles. You have difficulty urinating. You have pain that gets worse or does not improve with medicine. You have blood in your urine that does not go away after 1 week of resting and drinking more fluids. You have trouble having a bowel movement. You have trouble having or keeping an erection. No semen  comes out during orgasm (dry ejaculation). You have a urinary catheter in place, and you have: Spasms or pain. Problems with your catheter or your catheter is blocked. Get help right away if: You are unable to urinate. You are having more blood clots in your urine instead of fewer. You have: Large blood clots. A lot of blood in your urine. Pain in your  back or lower abdomen. You have difficulty breathing or shortness of breath. You develop swelling or pain in your leg. These symptoms may be an emergency. Get help right away. Call 911. Do not wait to see if the symptoms will go away. Do not drive yourself to the hospital. Summary After the procedure, it is common to have a small amount of blood in your urine. Follow restrictions about lifting and sexual activity as told by your health care provider. Ask what activities are safe for you. Keep all follow-up visits. This is important. This information is not intended to replace advice given to you by your health care provider. Make sure you discuss any questions you have with your health care provider. Document Revised: 02/08/2021 Document Reviewed: 02/08/2021 Elsevier Patient Education  2025 [employer].

## 2024-04-11 NOTE — Transfer of Care (Signed)
 Immediate Anesthesia Transfer of Care Note  Patient: SMARAN GAUS  Procedure(s) Performed: ABLATION, PROSTATE, TRANSURETHRAL, USING WATERJET  Patient Location: PACU  Anesthesia Type:General  Level of Consciousness: awake, alert , and oriented  Airway & Oxygen Therapy: Patient Spontanous Breathing and Patient connected to face mask oxygen  Post-op Assessment: Report given to RN  Post vital signs: Reviewed and stable  Last Vitals:  Vitals Value Taken Time  BP 139/89 04/11/24 11:00  Temp    Pulse 66 04/11/24 11:02  Resp 16 04/11/24 11:02  SpO2 100 % 04/11/24 11:02  Vitals shown include unfiled device data.  Last Pain:  Vitals:   04/11/24 0836  TempSrc:   PainSc: 0-No pain         Complications: No notable events documented.

## 2024-04-11 NOTE — Anesthesia Postprocedure Evaluation (Signed)
 Anesthesia Post Note  Patient: Robert Nichols  Procedure(s) Performed: ABLATION, PROSTATE, TRANSURETHRAL, USING WATERJET     Patient location during evaluation: PACU Anesthesia Type: General Level of consciousness: awake and alert Pain management: pain level controlled Vital Signs Assessment: post-procedure vital signs reviewed and stable Respiratory status: spontaneous breathing, nonlabored ventilation, respiratory function stable and patient connected to nasal cannula oxygen Cardiovascular status: blood pressure returned to baseline and stable Postop Assessment: no apparent nausea or vomiting Anesthetic complications: no   No notable events documented.  Last Vitals:  Vitals:   04/11/24 1333 04/11/24 1334  BP: 115/81   Pulse:  64  Resp:    Temp:    SpO2:  98%    Last Pain:  Vitals:   04/11/24 1330  TempSrc:   PainSc: 0-No pain                 Thom JONELLE Peoples

## 2024-04-14 ENCOUNTER — Other Ambulatory Visit (HOSPITAL_COMMUNITY): Payer: Self-pay

## 2024-04-14 LAB — SURGICAL PATHOLOGY

## 2024-04-14 MED ORDER — PHENAZOPYRIDINE HCL 100 MG PO TABS
100.0000 mg | ORAL_TABLET | Freq: Three times a day (TID) | ORAL | 1 refills | Status: AC
  Filled 2024-04-14: qty 30, 10d supply, fill #0
  Filled 2024-04-19: qty 30, 10d supply, fill #1

## 2024-04-14 MED ORDER — FINASTERIDE 5 MG PO TABS
5.0000 mg | ORAL_TABLET | Freq: Every day | ORAL | 3 refills | Status: AC
  Filled 2024-04-14: qty 90, 90d supply, fill #0
  Filled 2024-06-11: qty 90, 90d supply, fill #1

## 2024-04-15 ENCOUNTER — Other Ambulatory Visit (HOSPITAL_COMMUNITY): Payer: Self-pay

## 2024-04-15 ENCOUNTER — Other Ambulatory Visit: Payer: Self-pay

## 2024-04-15 MED ORDER — ZEPBOUND 10 MG/0.5ML ~~LOC~~ SOAJ
10.0000 mg | SUBCUTANEOUS | 11 refills | Status: AC
  Filled 2024-04-15 – 2024-05-09 (×2): qty 2, 28d supply, fill #0

## 2024-04-16 ENCOUNTER — Other Ambulatory Visit (HOSPITAL_COMMUNITY): Payer: Self-pay

## 2024-04-20 ENCOUNTER — Other Ambulatory Visit (HOSPITAL_COMMUNITY): Payer: Self-pay

## 2024-04-21 ENCOUNTER — Other Ambulatory Visit: Payer: Self-pay

## 2024-04-21 ENCOUNTER — Other Ambulatory Visit (HOSPITAL_COMMUNITY): Payer: Self-pay

## 2024-05-02 ENCOUNTER — Other Ambulatory Visit (HOSPITAL_COMMUNITY): Payer: Self-pay

## 2024-05-02 MED ORDER — SILDENAFIL CITRATE 100 MG PO TABS
100.0000 mg | ORAL_TABLET | Freq: Every day | ORAL | 1 refills | Status: AC | PRN
  Filled 2024-05-02 – 2024-05-09 (×2): qty 90, 90d supply, fill #0
  Filled 2024-06-11: qty 90, 90d supply, fill #1

## 2024-05-09 ENCOUNTER — Other Ambulatory Visit (HOSPITAL_COMMUNITY): Payer: Self-pay

## 2024-05-14 ENCOUNTER — Other Ambulatory Visit (HOSPITAL_COMMUNITY): Payer: Self-pay

## 2024-05-14 MED ORDER — ZEPBOUND 12.5 MG/0.5ML ~~LOC~~ SOAJ
12.5000 mg | SUBCUTANEOUS | 5 refills | Status: AC
  Filled 2024-05-14 – 2024-06-11 (×2): qty 2, 28d supply, fill #0

## 2024-05-23 ENCOUNTER — Other Ambulatory Visit (HOSPITAL_COMMUNITY): Payer: Self-pay

## 2024-06-11 ENCOUNTER — Other Ambulatory Visit (HOSPITAL_COMMUNITY): Payer: Self-pay

## 2024-06-11 ENCOUNTER — Other Ambulatory Visit: Payer: Self-pay
